# Patient Record
Sex: Female | Born: 1980 | Race: White | Hispanic: No | Marital: Married | Smoking: Former smoker
Health system: Southern US, Community
[De-identification: ages and names within clinical notes are randomized; demographics above are authoritative.]

## PROBLEM LIST (undated history)

## (undated) DIAGNOSIS — F419 Anxiety disorder, unspecified: Secondary | ICD-10-CM

## (undated) DIAGNOSIS — F32A Depression, unspecified: Secondary | ICD-10-CM

## (undated) DIAGNOSIS — J449 Chronic obstructive pulmonary disease, unspecified: Secondary | ICD-10-CM

## (undated) DIAGNOSIS — R7303 Prediabetes: Secondary | ICD-10-CM

## (undated) DIAGNOSIS — J45909 Unspecified asthma, uncomplicated: Secondary | ICD-10-CM

## (undated) HISTORY — PX: DENTAL SURGERY: SHX609

## (undated) HISTORY — PX: NO PAST SURGERIES: SHX2092

## (undated) HISTORY — PX: WISDOM TOOTH EXTRACTION: SHX21

---

## 2000-01-01 ENCOUNTER — Emergency Department (HOSPITAL_COMMUNITY): Admission: EM | Admit: 2000-01-01 | Discharge: 2000-01-01 | Payer: Self-pay | Admitting: *Deleted

## 2000-07-26 ENCOUNTER — Other Ambulatory Visit: Admission: RE | Admit: 2000-07-26 | Discharge: 2000-07-26 | Payer: Self-pay | Admitting: Obstetrics and Gynecology

## 2000-12-08 ENCOUNTER — Emergency Department (HOSPITAL_COMMUNITY): Admission: EM | Admit: 2000-12-08 | Discharge: 2000-12-08 | Payer: Self-pay | Admitting: Emergency Medicine

## 2001-12-22 ENCOUNTER — Emergency Department (HOSPITAL_COMMUNITY): Admission: EM | Admit: 2001-12-22 | Discharge: 2001-12-22 | Payer: Self-pay | Admitting: Emergency Medicine

## 2002-03-20 ENCOUNTER — Observation Stay (HOSPITAL_COMMUNITY): Admission: EM | Admit: 2002-03-20 | Discharge: 2002-03-21 | Payer: Self-pay | Admitting: Emergency Medicine

## 2002-03-20 ENCOUNTER — Encounter: Payer: Self-pay | Admitting: Obstetrics and Gynecology

## 2002-04-26 ENCOUNTER — Inpatient Hospital Stay (HOSPITAL_COMMUNITY): Admission: AD | Admit: 2002-04-26 | Discharge: 2002-04-26 | Payer: Self-pay | Admitting: Obstetrics and Gynecology

## 2002-04-27 ENCOUNTER — Encounter: Payer: Self-pay | Admitting: Obstetrics and Gynecology

## 2002-07-09 ENCOUNTER — Inpatient Hospital Stay (HOSPITAL_COMMUNITY): Admission: AD | Admit: 2002-07-09 | Discharge: 2002-07-09 | Payer: Self-pay | Admitting: Obstetrics and Gynecology

## 2002-10-14 ENCOUNTER — Inpatient Hospital Stay (HOSPITAL_COMMUNITY): Admission: AD | Admit: 2002-10-14 | Discharge: 2002-10-14 | Payer: Self-pay | Admitting: Obstetrics & Gynecology

## 2002-10-25 ENCOUNTER — Inpatient Hospital Stay (HOSPITAL_COMMUNITY): Admission: AD | Admit: 2002-10-25 | Discharge: 2002-10-25 | Payer: Self-pay | Admitting: Obstetrics and Gynecology

## 2002-10-30 ENCOUNTER — Inpatient Hospital Stay (HOSPITAL_COMMUNITY): Admission: AD | Admit: 2002-10-30 | Discharge: 2002-11-02 | Payer: Self-pay | Admitting: Obstetrics & Gynecology

## 2002-11-05 ENCOUNTER — Inpatient Hospital Stay (HOSPITAL_COMMUNITY): Admission: AD | Admit: 2002-11-05 | Discharge: 2002-11-05 | Payer: Self-pay | Admitting: Obstetrics and Gynecology

## 2002-11-30 ENCOUNTER — Other Ambulatory Visit: Admission: RE | Admit: 2002-11-30 | Discharge: 2002-11-30 | Payer: Self-pay | Admitting: Obstetrics and Gynecology

## 2003-12-04 ENCOUNTER — Ambulatory Visit (HOSPITAL_COMMUNITY): Admission: RE | Admit: 2003-12-04 | Discharge: 2003-12-04 | Payer: Self-pay | Admitting: Obstetrics & Gynecology

## 2004-06-29 ENCOUNTER — Emergency Department (HOSPITAL_COMMUNITY): Admission: EM | Admit: 2004-06-29 | Discharge: 2004-06-29 | Payer: Self-pay | Admitting: Family Medicine

## 2004-10-10 ENCOUNTER — Emergency Department (HOSPITAL_COMMUNITY): Admission: AD | Admit: 2004-10-10 | Discharge: 2004-10-10 | Payer: Self-pay | Admitting: Family Medicine

## 2005-06-21 ENCOUNTER — Emergency Department (HOSPITAL_COMMUNITY): Admission: EM | Admit: 2005-06-21 | Discharge: 2005-06-21 | Payer: Self-pay | Admitting: Emergency Medicine

## 2008-11-21 ENCOUNTER — Encounter: Admission: RE | Admit: 2008-11-21 | Discharge: 2008-11-21 | Payer: Self-pay | Admitting: Family Medicine

## 2013-08-13 ENCOUNTER — Emergency Department
Admission: EM | Admit: 2013-08-13 | Discharge: 2013-08-13 | Disposition: A | Discharge status: Home / Self Care | Payer: Self-pay | Source: Home / Self Care | Attending: Family Medicine | Admitting: Family Medicine

## 2013-08-13 DIAGNOSIS — J069 Acute upper respiratory infection, unspecified: Secondary | ICD-10-CM

## 2013-08-13 MED ORDER — BENZONATATE 200 MG PO CAPS: ORAL_CAPSULE | ORAL | Status: DC

## 2013-08-13 MED ORDER — AZITHROMYCIN 250 MG PO TABS: ORAL_TABLET | ORAL | Status: DC

## 2013-08-13 MED ORDER — PREDNISONE 20 MG PO TABS: 20.0000 mg | ORAL_TABLET | Freq: Two times a day (BID) | ORAL | Status: DC

## 2013-08-13 NOTE — ED Notes (Signed)
Sinus pain, pressure, fever, chills "ears feel like I'm under water", congestion, runny nose, headache, started with sore throat 6 days ago

## 2013-08-13 NOTE — ED Provider Notes (Signed)
CSN: [id number]     Arrival date & time 08/13/13  1729 History   First MD Initiated Contact with Patient 08/13/13 1816     Chief Complaint  Patient presents with  . Sinus Problem      HPI Comments: Patient states she had a URI last month that lasted about 3 weeks before resolving.  One week ago she developed a recurrent sore throat, nasal congestion, cough, fatigue, and myalgias.  Yesterday she developed a low grade fever.  She also developed a pressure sensation in her left ear yesterday.  The history is provided by the patient and the spouse.    Past medical history:  Hypertension, migraines No past surgical history   Family history:  Hypertension, Diabetes History  Substance Use Topics  . Smoking status: One pack per day   . Smokeless tobacco: Not on file  . Alcohol Use: Not on file   OB History   No data available     Review of Systems + sore throat + cough No pleuritic pain but has tightness in chest No wheezing + nasal congestion + post-nasal drainage + sinus pain/pressure No itchy/red eyes ? left earache No hemoptysis No SOB + low grade fever, + chills No nausea No vomiting No abdominal pain No diarrhea No urinary symptoms No skin rashes + fatigue + myalgias + headache Used OTC meds without relief  Allergies  Penicillins  Home Medications   Current Outpatient Rx  Name  Route  Sig  Dispense  Refill  . sertraline (ZOLOFT) 50 MG tablet   Oral   Take 50 mg by mouth daily.         Marland Kitchen azithromycin (ZITHROMAX Z-PAK) 250 MG tablet      Take 2 tabs today; then begin one tab once daily for 4 more days. (Rx void after 08/21/13)   6 each   0   . benzonatate (TESSALON) 200 MG capsule      Take one cap at bedtime as necessary for cough   12 capsule   0   . predniSONE (DELTASONE) 20 MG tablet   Oral   Take 1 tablet (20 mg total) by mouth 2 (two) times daily.   11 tablet   0    BP 135/87  Pulse 81  Temp(Src) 98.4 F (36.9 C) (Oral)  Ht 5\' 11"   (1.803 m)  Wt 176 lb (79.833 kg)  BMI 24.56 kg/m2  SpO2 100% Physical Exam Nursing notes and Vital Signs reviewed. Appearance:  Patient appears healthy, stated age, and in no acute distress Eyes:  Pupils are equal, round, and reactive to light and accomodation.  Extraocular movement is intact.  Conjunctivae are not inflamed  Ears:  Canals normal.  Tympanic membranes normal.  Nose:  Mildly congested turbinates.  No sinus tenderness.    Pharynx:  Normal Neck:  Supple.   Tender shotty posterior nodes are palpated bilaterally  Lungs:  Clear to auscultation.  Breath sounds are equal.  Heart:  Regular rate and rhythm without murmurs, rubs, or gallops.  Abdomen:  Nontender without masses or hepatosplenomegaly.  Bowel sounds are present.  No CVA or flank tenderness.  Extremities:  No edema.  No calf tenderness Skin:  No rash present.   ED Course  Procedures  none   Labs Reviewed -  Tympanogram:  Positive peak pressure left ear; normal right ear       MDM   1. Acute upper respiratory infections of unspecified site; suspect recurrent viral URI  There is no evidence of bacterial infection today.    Begin prednisone burst.  Prescription written for Benzonatate (Tessalon) to take at bedtime for night-time cough.  Take Mucinex D (guaifenesin with decongestant) twice daily for congestion (or may take plain Mucinex plus Sudafed).  Increase fluid intake, rest. May use Afrin nasal spray (or generic oxymetazoline) twice daily for about 5 days.  Also recommend using saline nasal spray several times daily and saline nasal irrigation (AYR is a common brand).  Use prescription nose spray after Afrin and saline rinse. Stop all antihistamines for now, and other non-prescription cough/cold preparations. Begin Azithromycin if not improving about one week or if persistent fever develops (Given a prescription to hold, with an expiration date)  Follow-up with family doctor if not improving about10 days.      Lattie Haw, MD 08/14/13 1239

## 2014-01-18 ENCOUNTER — Other Ambulatory Visit (HOSPITAL_COMMUNITY): Payer: Self-pay | Admitting: Sports Medicine

## 2014-01-18 DIAGNOSIS — M25569 Pain in unspecified knee: Principal | ICD-10-CM

## 2014-01-18 DIAGNOSIS — G8929 Other chronic pain: Secondary | ICD-10-CM

## 2014-02-05 ENCOUNTER — Ambulatory Visit (HOSPITAL_COMMUNITY)
Admission: RE | Admit: 2014-02-05 | Discharge: 2014-02-05 | Disposition: A | Discharge status: Home / Self Care | Payer: Self-pay | Source: Ambulatory Visit | Attending: Sports Medicine | Admitting: Sports Medicine

## 2014-02-05 DIAGNOSIS — M2390 Unspecified internal derangement of unspecified knee: Secondary | ICD-10-CM | POA: Insufficient documentation

## 2014-02-05 DIAGNOSIS — G8929 Other chronic pain: Secondary | ICD-10-CM

## 2014-02-05 DIAGNOSIS — M25569 Pain in unspecified knee: Secondary | ICD-10-CM | POA: Insufficient documentation

## 2015-01-21 ENCOUNTER — Ambulatory Visit
Admission: RE | Admit: 2015-01-21 | Discharge: 2015-01-21 | Disposition: A | Discharge status: Home / Self Care | Payer: 59 | Source: Ambulatory Visit | Attending: Family Medicine | Admitting: Family Medicine

## 2015-01-21 ENCOUNTER — Other Ambulatory Visit: Payer: Self-pay | Admitting: Family Medicine

## 2015-01-21 DIAGNOSIS — R609 Edema, unspecified: Secondary | ICD-10-CM

## 2015-01-21 DIAGNOSIS — M519 Unspecified thoracic, thoracolumbar and lumbosacral intervertebral disc disorder: Secondary | ICD-10-CM

## 2015-01-25 ENCOUNTER — Ambulatory Visit
Admission: RE | Admit: 2015-01-25 | Discharge: 2015-01-25 | Disposition: A | Discharge status: Home / Self Care | Payer: 59 | Source: Ambulatory Visit | Attending: Family Medicine | Admitting: Family Medicine

## 2015-01-25 DIAGNOSIS — M519 Unspecified thoracic, thoracolumbar and lumbosacral intervertebral disc disorder: Secondary | ICD-10-CM

## 2015-11-13 ENCOUNTER — Other Ambulatory Visit: Payer: Self-pay | Admitting: Family Medicine

## 2015-11-13 DIAGNOSIS — N6310 Unspecified lump in the right breast, unspecified quadrant: Secondary | ICD-10-CM

## 2015-11-17 ENCOUNTER — Other Ambulatory Visit (HOSPITAL_COMMUNITY): Payer: Self-pay | Admitting: *Deleted

## 2015-11-17 ENCOUNTER — Other Ambulatory Visit: Payer: Self-pay

## 2015-11-17 DIAGNOSIS — N6310 Unspecified lump in the right breast, unspecified quadrant: Secondary | ICD-10-CM

## 2015-11-20 ENCOUNTER — Other Ambulatory Visit (HOSPITAL_COMMUNITY): Payer: Self-pay | Admitting: Obstetrics and Gynecology

## 2015-11-20 ENCOUNTER — Encounter (HOSPITAL_COMMUNITY): Payer: Self-pay

## 2015-11-20 ENCOUNTER — Ambulatory Visit (HOSPITAL_COMMUNITY)
Admission: RE | Admit: 2015-11-20 | Discharge: 2015-11-20 | Disposition: A | Discharge status: Home / Self Care | Payer: No Typology Code available for payment source | Source: Ambulatory Visit | Attending: Obstetrics and Gynecology | Admitting: Obstetrics and Gynecology

## 2015-11-20 ENCOUNTER — Ambulatory Visit
Admission: RE | Admit: 2015-11-20 | Discharge: 2015-11-20 | Disposition: A | Discharge status: Home / Self Care | Payer: No Typology Code available for payment source | Source: Ambulatory Visit | Attending: Obstetrics and Gynecology | Admitting: Obstetrics and Gynecology

## 2015-11-20 VITALS — BP 118/72 | Temp 98.7°F | Ht 71.0 in | Wt 153.0 lb

## 2015-11-20 DIAGNOSIS — N6310 Unspecified lump in the right breast, unspecified quadrant: Secondary | ICD-10-CM

## 2015-11-20 DIAGNOSIS — Z1239 Encounter for other screening for malignant neoplasm of breast: Secondary | ICD-10-CM

## 2015-11-20 NOTE — Patient Instructions (Signed)
Educational materials on self breast awareness given. Explained to Maria Knight that she is due for a Knight smear. Let her know [facility] will cover Knight smears every 3 years unless has a history of abnormal Knight smears. Told patient to call Sabrina to schedule. Referred patient to the [facility] for diagnostic mammogram and possible right breast ultrasound. Appointment scheduled for Thursday, November 20, 2015 at 1500. Patient aware of appointment and will be there. Smoking cessation discussed with patient. Referred patient to the [facility] Quitline and gave resources to the free smoking cessation classes at the [facility]. Maria Knight verbalized understanding.  [place]ann[place], Kathaleen Maser, RN 1:31 PM

## 2015-11-20 NOTE — Progress Notes (Signed)
Complaints of three right breast lumps x 1 month. Patient complained of pain within the right breast that comes and goes. Patient rates the pain at a 5-6 out of 10.  Pap Smear:  Pap smear not completed today. Last Pap smear was 6 years ago at [facility] in [place] and normal per patient. Per patient has no history of an abnormal Pap smear. Pap smear not completed due to patient started menstrual cycle and stated it is heavy at this time. Patient told to call Martie Lee to schedule appointment with [facility]. No Pap smear results in EPIC.  Physical exam: Breasts Breasts symmetrical. No skin abnormalities bilateral breasts. No nipple retraction bilateral breasts. No nipple discharge bilateral breasts. No lymphadenopathy. No lumps palpated left breast. Palpated two moveable lumps within the right breast. Palpated a lump at 9 o'clock 3 cm from the nipple and at 12 o'clock 4 cm from the nipple. Complaints of tenderness when palpated both lumps. Referred patient to the [facility] for diagnostic mammogram and possible right breast ultrasound. Appointment scheduled for Thursday, November 20, 2015 at 1500.    Pelvic/Bimanual No Pap smear completed today since patient is currently on menstrual cycle. Patient will call to reschedule.  Smoking History: Smoking cessation discussed with patient. Referred patient to the [facility] Quitline and gave resources to the free smoking cessation classes at the [facility].  Patient Navigation: Patient education provided. Access to services provided for patient through [facility] program.

## 2015-11-21 ENCOUNTER — Other Ambulatory Visit: Payer: No Typology Code available for payment source

## 2015-11-24 ENCOUNTER — Encounter (HOSPITAL_COMMUNITY): Payer: Self-pay | Admitting: *Deleted

## 2016-05-03 ENCOUNTER — Telehealth (HOSPITAL_COMMUNITY): Payer: Self-pay | Admitting: *Deleted

## 2016-05-03 NOTE — Telephone Encounter (Signed)
Telephoned patient at home # and left message to return call to [facility] 

## 2017-06-30 ENCOUNTER — Other Ambulatory Visit: Payer: Self-pay | Admitting: Family Medicine

## 2017-06-30 DIAGNOSIS — R1032 Left lower quadrant pain: Secondary | ICD-10-CM

## 2017-06-30 DIAGNOSIS — R634 Abnormal weight loss: Secondary | ICD-10-CM

## 2017-07-06 ENCOUNTER — Other Ambulatory Visit: Payer: No Typology Code available for payment source

## 2017-07-20 ENCOUNTER — Ambulatory Visit
Admission: RE | Admit: 2017-07-20 | Discharge: 2017-07-20 | Disposition: A | Discharge status: Home / Self Care | Payer: No Typology Code available for payment source | Source: Ambulatory Visit | Attending: Family Medicine | Admitting: Family Medicine

## 2017-07-20 DIAGNOSIS — R1032 Left lower quadrant pain: Secondary | ICD-10-CM

## 2017-07-20 DIAGNOSIS — R634 Abnormal weight loss: Secondary | ICD-10-CM

## 2017-08-23 ENCOUNTER — Emergency Department (HOSPITAL_BASED_OUTPATIENT_CLINIC_OR_DEPARTMENT_OTHER)
Admission: EM | Admit: 2017-08-23 | Discharge: 2017-08-23 | Disposition: A | Discharge status: Home / Self Care | Payer: Self-pay | Attending: Emergency Medicine | Admitting: Emergency Medicine

## 2017-08-23 ENCOUNTER — Emergency Department (HOSPITAL_BASED_OUTPATIENT_CLINIC_OR_DEPARTMENT_OTHER): Payer: Self-pay

## 2017-08-23 ENCOUNTER — Encounter (HOSPITAL_BASED_OUTPATIENT_CLINIC_OR_DEPARTMENT_OTHER): Payer: Self-pay

## 2017-08-23 DIAGNOSIS — N760 Acute vaginitis: Secondary | ICD-10-CM | POA: Insufficient documentation

## 2017-08-23 DIAGNOSIS — Z79899 Other long term (current) drug therapy: Secondary | ICD-10-CM | POA: Insufficient documentation

## 2017-08-23 DIAGNOSIS — R1030 Lower abdominal pain, unspecified: Secondary | ICD-10-CM

## 2017-08-23 DIAGNOSIS — F17200 Nicotine dependence, unspecified, uncomplicated: Secondary | ICD-10-CM | POA: Insufficient documentation

## 2017-08-23 DIAGNOSIS — Z711 Person with feared health complaint in whom no diagnosis is made: Secondary | ICD-10-CM

## 2017-08-23 DIAGNOSIS — Z91040 Latex allergy status: Secondary | ICD-10-CM | POA: Insufficient documentation

## 2017-08-23 DIAGNOSIS — R1031 Right lower quadrant pain: Secondary | ICD-10-CM | POA: Insufficient documentation

## 2017-08-23 DIAGNOSIS — B9689 Other specified bacterial agents as the cause of diseases classified elsewhere: Secondary | ICD-10-CM

## 2017-08-23 HISTORY — DX: Anxiety disorder, unspecified: F41.9

## 2017-08-23 LAB — COMPREHENSIVE METABOLIC PANEL
ALBUMIN: 4.2 g/dL (ref 3.5–5.0)
ALT: 14 U/L (ref 14–54)
AST: 18 U/L (ref 15–41)
Alkaline Phosphatase: 63 U/L (ref 38–126)
Anion gap: 5 (ref 5–15)
BUN: 12 mg/dL (ref 6–20)
CALCIUM: 9.1 mg/dL (ref 8.9–10.3)
CO2: 25 mmol/L (ref 22–32)
Chloride: 108 mmol/L (ref 101–111)
Creatinine, Ser: 0.52 mg/dL (ref 0.44–1.00)
GFR calc Af Amer: >60 mL/min (ref >60)
GFR calc non Af Amer: >60 mL/min (ref >60)
GLUCOSE: 96 mg/dL (ref 65–99)
POTASSIUM: 3.8 mmol/L (ref 3.5–5.1)
SODIUM: 138 mmol/L (ref 135–145)
TOTAL PROTEIN: 6.8 g/dL (ref 6.5–8.1)
Total Bilirubin: 0.5 mg/dL (ref 0.3–1.2)

## 2017-08-23 LAB — CBC WITH DIFFERENTIAL/PLATELET
BASOS ABS: 0 10*3/uL (ref 0.0–0.1)
BASOS PCT: 0 %
EOS ABS: 0.1 10*3/uL (ref 0.0–0.7)
EOS PCT: 1 %
HCT: 32.9 % — ABNORMAL LOW (ref 36.0–46.0)
Hemoglobin: 10.9 g/dL — ABNORMAL LOW (ref 12.0–15.0)
Lymphocytes Relative: 21 %
Lymphs Abs: 2.1 10*3/uL (ref 0.7–4.0)
MCH: 30.8 pg (ref 26.0–34.0)
MCHC: 33.1 g/dL (ref 30.0–36.0)
MCV: 92.9 fL (ref 78.0–100.0)
MONO ABS: 0.7 10*3/uL (ref 0.1–1.0)
MONOS PCT: 8 %
Neutro Abs: 6.8 10*3/uL (ref 1.7–7.7)
Neutrophils Relative %: 70 %
PLATELETS: 157 10*3/uL (ref 150–400)
RBC: 3.54 MIL/uL — ABNORMAL LOW (ref 3.87–5.11)
RDW: 12.4 % (ref 11.5–15.5)
WBC: 9.8 10*3/uL (ref 4.0–10.5)

## 2017-08-23 LAB — URINALYSIS, ROUTINE W REFLEX MICROSCOPIC
Bilirubin Urine: NEGATIVE
Glucose, UA: NEGATIVE mg/dL
Hgb urine dipstick: NEGATIVE
Ketones, ur: NEGATIVE mg/dL
Leukocytes, UA: NEGATIVE
Nitrite: NEGATIVE
PH: 7.5 (ref 5.0–8.0)
Protein, ur: NEGATIVE mg/dL
SPECIFIC GRAVITY, URINE: 1.01 (ref 1.005–1.030)

## 2017-08-23 LAB — LIPASE, BLOOD: Lipase: 19 U/L (ref 11–51)

## 2017-08-23 LAB — WET PREP, GENITAL
Sperm: NONE SEEN
Trich, Wet Prep: NONE SEEN
YEAST WET PREP: NONE SEEN

## 2017-08-23 LAB — PREGNANCY, URINE: PREG TEST UR: NEGATIVE

## 2017-08-23 MED ORDER — METRONIDAZOLE 500 MG PO TABS: 500.0000 mg | ORAL_TABLET | Freq: Two times a day (BID) | ORAL | 0 refills | Status: DC

## 2017-08-23 MED ORDER — DICYCLOMINE HCL 10 MG PO CAPS
10.0000 mg | ORAL_CAPSULE | Freq: Once | ORAL | Status: AC
  Administered 2017-08-23: 10 mg via ORAL
  Filled 2017-08-23: qty 1

## 2017-08-23 MED ORDER — AZITHROMYCIN 250 MG PO TABS
1000.0000 mg | ORAL_TABLET | Freq: Once | ORAL | Status: AC
  Administered 2017-08-23: 1000 mg via ORAL
  Filled 2017-08-23: qty 4

## 2017-08-23 MED ORDER — POLYETHYLENE GLYCOL 3350 17 GM/SCOOP PO POWD: 17.0000 g | Freq: Two times a day (BID) | ORAL | 0 refills | Status: DC

## 2017-08-23 MED ORDER — CEFTRIAXONE SODIUM 250 MG IJ SOLR
250.0000 mg | Freq: Once | INTRAMUSCULAR | Status: AC
  Administered 2017-08-23: 250 mg via INTRAMUSCULAR
  Filled 2017-08-23: qty 250

## 2017-08-23 MED ORDER — ONDANSETRON 4 MG PO TBDP
4.0000 mg | ORAL_TABLET | Freq: Once | ORAL | Status: AC
  Administered 2017-08-23: 4 mg via ORAL
  Filled 2017-08-23: qty 1

## 2017-08-23 MED ORDER — IOPAMIDOL (ISOVUE-300) INJECTION 61%
100.0000 mL | Freq: Once | INTRAVENOUS | Status: AC | PRN
  Administered 2017-08-23: 100 mL via INTRAVENOUS

## 2017-08-23 MED ORDER — DICYCLOMINE HCL 20 MG PO TABS: 20.0000 mg | ORAL_TABLET | Freq: Two times a day (BID) | ORAL | 0 refills | Status: DC

## 2017-08-23 NOTE — ED Notes (Signed)
ED Provider at bedside. 

## 2017-08-23 NOTE — ED Triage Notes (Signed)
C/o abd pain since July 2-felt she had a pulled muscle-was seen by chiropractor and PCP with US done-"normal" US-pain worse x 4 days-pos nausea-denies v/d-NAD-steady gait

## 2017-08-23 NOTE — ED Provider Notes (Signed)
[facility] EMERGENCY DEPARTMENT Provider Note   CSN: [id number] Arrival date & time: 08/23/17  1343     History   Chief Complaint Chief Complaint  Patient presents with  . Abdominal Pain    HPI NASHA DISS is a 36 y.o. female.  NAVEYA ELLERMAN is a 36 y.o. Female who presents to the ED complaining of ongoing abdominal pain since April 25 2017.  Patient reports her pain began around July 2 of this year with pain to her left side of her abdomen and to her left back.  She saw a chiropractor and reports that this did not help with her symptoms.  She reports seeing her primary care doctor in September who did a pelvic ultrasound that was normal.  She reports her pain is been constant for the past several months and sometimes worsens.  She is unable to identify alleviating or aggravating factors.  She reports lately her pain has been starting in her left lower quadrant and radiates into her right lower quadrant.  She reports her pain worsened about 5 days ago.  She denies any nausea, vomiting or diarrhea.  Last bowel movement was today and was normal.  She also reports some pain in her abdomen when she urinates.  She denies urinary frequency or urgency.  She has had no previous abdominal surgeries.  No fevers.  No significant changes to her appetite recently.  She denies fevers, vaginal bleeding, vaginal discharge, urinary frequency, urinary urgency, nausea, vomiting, diarrhea, chest pain, or rashes.    The history is provided by the patient, medical records and the spouse. No language interpreter was used.    Past Medical History:  Diagnosis Date  . Anxiety     There are no active problems to display for this patient.   History reviewed. No pertinent surgical history.  OB History    Gravida Para Term Preterm AB Living   1 1 1     1    SAB TAB Ectopic Multiple Live Births                   Home Medications    Prior to Admission medications   Medication Sig Start  Date End Date Taking? Authorizing Provider  ALPRAZolam Prudy Feeler) 0.5 MG tablet Take 0.5 mg by mouth every morning.    [provider]  dicyclomine (BENTYL) 20 MG tablet Take 1 tablet (20 mg total) by mouth 2 (two) times daily. 08/23/17   Everlene Farrier, PA-C  metroNIDAZOLE (FLAGYL) 500 MG tablet Take 1 tablet (500 mg total) by mouth 2 (two) times daily. 08/23/17   Everlene Farrier, PA-C  polyethylene glycol powder (GLYCOLAX/MIRALAX) powder Take 17 g by mouth 2 (two) times daily. 08/23/17   Everlene Farrier, PA-C    Family History Family History  Problem Relation Age of Onset  . Diabetes Mother   . Cancer Mother        skin  . Hypertension Mother   . Diabetes Maternal Grandfather   . Heart disease Maternal Grandfather   . Stroke Maternal Grandfather     Social History Social History  Substance Use Topics  . Smoking status: Current Every Day Smoker    Packs/day: 0.50    Years: 18.00  . Smokeless tobacco: Never Used  . Alcohol use Yes     Comment: occ     Allergies   Latex and Penicillins   Review of Systems Review of Systems  Constitutional: Negative for chills and fever.  HENT:  Negative for congestion and sore throat.   Eyes: Negative for visual disturbance.  Respiratory: Negative for cough and shortness of breath.   Cardiovascular: Negative for chest pain.  Gastrointestinal: Positive for abdominal pain. Negative for blood in stool, constipation, diarrhea, nausea and vomiting.  Genitourinary: Positive for dysuria. Negative for decreased urine volume, difficulty urinating, flank pain, frequency, hematuria, menstrual problem, pelvic pain, urgency, vaginal bleeding, vaginal discharge and vaginal pain.  Musculoskeletal: Positive for back pain. Negative for neck pain.  Skin: Negative for rash.  Neurological: Negative for weakness, light-headedness, numbness and headaches.     Physical Exam Updated Vital Signs BP 121/81   Pulse 82   Temp 98.2 F (36.8 C) (Oral)    Resp 16   Ht 5\' 11"  (1.803 m)   Wt 57.7 kg (127 lb 3.3 oz)   LMP 07/06/2017   SpO2 100%   BMI 17.74 kg/m   Physical Exam  Constitutional: She appears well-developed and well-nourished. No distress.  Nontoxic-appearing.  HENT:  Head: Normocephalic and atraumatic.  Mouth/Throat: Oropharynx is clear and moist.  Eyes: Pupils are equal, round, and reactive to light. Conjunctivae are normal. Right eye exhibits no discharge. Left eye exhibits no discharge.  Neck: Neck supple.  Cardiovascular: Normal rate, regular rhythm, normal heart sounds and intact distal pulses.  Exam reveals no gallop and no friction rub.   No murmur heard. Pulmonary/Chest: Effort normal and breath sounds normal. No respiratory distress. She has no wheezes. She has no rales.  Abdominal: Soft. Bowel sounds are normal. She exhibits no distension and no mass. There is tenderness. There is no rebound and no guarding.  Abdomen is soft.  Bowel sounds are present.  Patient has tenderness across her bilateral lower abdomen including her suprapubic region.  No psoas or obturator sign.  No CVA tenderness.  Genitourinary:  Genitourinary Comments: Pelvic exam with female RN as chaperone. No external lesions or rashes noted. Mild white vaginal discharge noted. Cervix is closed. No CMT. No vaginal bleeding. No adnexal tenderness or fullness.   Musculoskeletal: She exhibits no edema.  Lymphadenopathy:    She has no cervical adenopathy.  Neurological: She is alert. Coordination normal.  Skin: Skin is warm and dry. No rash noted. She is not diaphoretic. No erythema. No pallor.  Psychiatric: She has a normal mood and affect. Her behavior is normal.  Nursing note and vitals reviewed.    ED Treatments / Results  Labs (all labs ordered are listed, but only abnormal results are displayed) Labs Reviewed  WET PREP, GENITAL - Abnormal; Notable for the following:       Result Value   Clue Cells Wet Prep HPF POC PRESENT (*)    WBC, Wet  Prep HPF POC MANY (*)    All other components within normal limits  CBC WITH DIFFERENTIAL/PLATELET - Abnormal; Notable for the following:    RBC 3.54 (*)    Hemoglobin 10.9 (*)    HCT 32.9 (*)    All other components within normal limits  URINALYSIS, ROUTINE W REFLEX MICROSCOPIC  PREGNANCY, URINE  COMPREHENSIVE METABOLIC PANEL  LIPASE, BLOOD  RPR  HIV ANTIBODY (ROUTINE TESTING)  GC/CHLAMYDIA PROBE AMP ([place]) NOT AT [facility]    EKG  EKG Interpretation None       Radiology Ct Abdomen Pelvis W Contrast  Result Date: 08/23/2017 CLINICAL DATA:  Lower abdominal pain since July 2018. Negative pregnancy test. EXAM: CT ABDOMEN AND PELVIS WITH CONTRAST TECHNIQUE: Multidetector CT imaging of the abdomen and pelvis was  performed using the standard protocol following bolus administration of intravenous contrast. CONTRAST:  100mL ISOVUE-300 IOPAMIDOL (ISOVUE-300) INJECTION 61% COMPARISON:  Ultrasound pelvis 12/04/2003 FINDINGS: Lower chest: Negative Hepatobiliary: Normal liver.  Gallbladder and bile ducts normal. Pancreas: Negative Spleen: Negative Adrenals/Urinary Tract: Negative.  No urinary tract calculi. Stomach/Bowel: Stomach is normal. Negative for bowel obstruction. No bowel mass or edema. Appendix not visualized but no evidence of bowel thickening in the right lower quadrant. Vascular/Lymphatic: Negative Reproductive: Normal uterus. Multiple cysts in the right adnexum, the largest being 30 x 39 mm. Other cysts measure 1 cm. Left adnexum normal. Other: No free fluid. Musculoskeletal: Negative IMPRESSION: Right ovarian cysts, similar in size to the described in 2005. No free fluid. These are likely functional follicles. Otherwise negative. Appendix not visualized. Electronically Signed   By: Marlan Palauharles  Clark M.D.   On: 08/23/2017 19:13    Procedures Procedures (including critical care time)  Medications Ordered in ED Medications  cefTRIAXone (ROCEPHIN) injection 250 mg (not administered)    azithromycin (ZITHROMAX) tablet 1,000 mg (not administered)  iopamidol (ISOVUE-300) 61 % injection 100 mL (100 mLs Intravenous Contrast Given 08/23/17 1840)  dicyclomine (BENTYL) capsule 10 mg (10 mg Oral Given 08/23/17 2008)     Initial Impression / Assessment and Plan / ED Course  I have reviewed the triage vital signs and the nursing notes.  Pertinent labs & imaging results that were available during my care of the patient were reviewed by me and considered in my medical decision making (see chart for details).     This  is a 36 y.o. Female who presents to the ED complaining of ongoing abdominal pain since April 25 2017.  Patient reports her pain began around July 2 of this year with pain to her left side of her abdomen and to her left back.  She saw a chiropractor and reports that this did not help with her symptoms.  She reports seeing her primary care doctor in September who did a pelvic ultrasound that was normal.  She reports her pain is been constant for the past several months and sometimes worsens.  She is unable to identify alleviating or aggravating factors.  She reports lately her pain has been starting in her left lower quadrant and radiates into her right lower quadrant.  She reports her pain worsened about 5 days ago.  She denies any nausea, vomiting or diarrhea.  Last bowel movement was today and was normal.  She also reports some pain in her abdomen when she urinates.  She denies urinary frequency or urgency.  She has had no previous abdominal surgeries.  No fevers.  She denies vaginal bleeding or discharge.  On exam the patient is afebrile nontoxic-appearing.  Her abdomen is soft and she has bilateral lower abdominal tenderness to palpation and suprapubic tenderness to palpation.  No psoas or obturator sign.  No CVA or flank tenderness. A pregnancy test is negative.  Urinalysis is without sign of action.  CBC shows no leukocytosis.  Lipase is within normal limits.  CMP is  unremarkable.  CT abdomen and pelvis with contrast was obtained which showed right ovarian cysts which are similar in size that were described in 2005.  No free fluid.  These are likely functional follicles.  No other findings on CT scan.  At reevaluation I discussed test findings.  Plan for pelvic exam and STD check.  Patient agrees with plan. On pelvic exam she has a slight amount of white vaginal discharge.  No cervical  motion tenderness.  No adnexal tenderness or fullness.  Wet prep reveals many white blood cells and bacterial vaginosis.  I discussed test findings.  I discussed that she has pending testing for STDs.  She agrees with plan to prophylactically treat for STDs with Rocephin and azithromycin here in the emergency department.  Will also treat with Flagyl for bacterial vaginosis at home.  Patient tolerating p.o. in the emergency department.  Will discharge with some Bentyl and MiraLAX in case this is irritable bowel versus constipation.  We will have her follow closely with primary care and I suggested that if her symptoms persist she may want follow-up with gastroenterology.  She agrees with plan.  Return precautions discussed. I advised the patient to follow-up with their primary care provider this week. I advised the patient to return to the emergency department with new or worsening symptoms or new concerns. The patient verbalized understanding and agreement with plan.    Final Clinical Impressions(s) / ED Diagnoses   Final diagnoses:  Lower abdominal pain  Concern about STD in female without diagnosis  BV (bacterial vaginosis)    New Prescriptions New Prescriptions   DICYCLOMINE (BENTYL) 20 MG TABLET    Take 1 tablet (20 mg total) by mouth 2 (two) times daily.   METRONIDAZOLE (FLAGYL) 500 MG TABLET    Take 1 tablet (500 mg total) by mouth 2 (two) times daily.   POLYETHYLENE GLYCOL POWDER (GLYCOLAX/MIRALAX) POWDER    Take 17 g by mouth 2 (two) times daily.     Everlene Farrier, PA-C 08/23/17 2050    Arby Barrette, MD 08/24/17 Ebony Cargo

## 2017-08-25 LAB — HIV ANTIBODY (ROUTINE TESTING W REFLEX): HIV Screen 4th Generation wRfx: NONREACTIVE

## 2017-08-25 LAB — RPR: RPR Ser Ql: NONREACTIVE

## 2017-08-25 LAB — GC/CHLAMYDIA PROBE AMP (~~LOC~~) NOT AT ARMC
CHLAMYDIA, DNA PROBE: NEGATIVE
Neisseria Gonorrhea: NEGATIVE

## 2018-12-08 ENCOUNTER — Ambulatory Visit (HOSPITAL_COMMUNITY): Payer: Self-pay | Admitting: Psychiatry

## 2022-03-08 ENCOUNTER — Emergency Department (HOSPITAL_BASED_OUTPATIENT_CLINIC_OR_DEPARTMENT_OTHER): Payer: Self-pay | Admitting: Radiology

## 2022-03-08 ENCOUNTER — Emergency Department (HOSPITAL_BASED_OUTPATIENT_CLINIC_OR_DEPARTMENT_OTHER): Payer: Self-pay

## 2022-03-08 ENCOUNTER — Emergency Department (HOSPITAL_BASED_OUTPATIENT_CLINIC_OR_DEPARTMENT_OTHER)
Admission: EM | Admit: 2022-03-08 | Discharge: 2022-03-09 | Disposition: A | Discharge status: Home / Self Care | Payer: Self-pay | Attending: Emergency Medicine | Admitting: Emergency Medicine

## 2022-03-08 ENCOUNTER — Other Ambulatory Visit: Payer: Self-pay

## 2022-03-08 ENCOUNTER — Encounter (HOSPITAL_BASED_OUTPATIENT_CLINIC_OR_DEPARTMENT_OTHER): Payer: Self-pay

## 2022-03-08 DIAGNOSIS — Y9389 Activity, other specified: Secondary | ICD-10-CM | POA: Insufficient documentation

## 2022-03-08 DIAGNOSIS — S92324A Nondisplaced fracture of second metatarsal bone, right foot, initial encounter for closed fracture: Secondary | ICD-10-CM | POA: Insufficient documentation

## 2022-03-08 DIAGNOSIS — M25561 Pain in right knee: Secondary | ICD-10-CM | POA: Insufficient documentation

## 2022-03-08 DIAGNOSIS — W1839XA Other fall on same level, initial encounter: Secondary | ICD-10-CM | POA: Insufficient documentation

## 2022-03-08 DIAGNOSIS — M542 Cervicalgia: Secondary | ICD-10-CM | POA: Insufficient documentation

## 2022-03-08 DIAGNOSIS — Z91040 Latex allergy status: Secondary | ICD-10-CM | POA: Insufficient documentation

## 2022-03-08 DIAGNOSIS — S92334A Nondisplaced fracture of third metatarsal bone, right foot, initial encounter for closed fracture: Secondary | ICD-10-CM | POA: Insufficient documentation

## 2022-03-08 DIAGNOSIS — S92344A Nondisplaced fracture of fourth metatarsal bone, right foot, initial encounter for closed fracture: Secondary | ICD-10-CM | POA: Insufficient documentation

## 2022-03-08 MED ORDER — HYDROCODONE-ACETAMINOPHEN 5-325 MG PO TABS
1.0000 | ORAL_TABLET | ORAL | 0 refills | Status: DC | PRN
Start: 2022-03-08 — End: 2024-05-07

## 2022-03-08 MED ORDER — HYDROCODONE-ACETAMINOPHEN 5-325 MG PO TABS
1.0000 | ORAL_TABLET | Freq: Once | ORAL | Status: AC
  Administered 2022-03-08: 1 via ORAL
  Filled 2022-03-08: qty 1

## 2022-03-08 NOTE — Discharge Instructions (Addendum)
You were seen today for multiple injuries secondary to a fall.  Fractures were noted in the second, third, and fourth metatarsal bones.  You have been placed in a splint.  Please keep the right lower extremity nonweightbearing at all times.  Utilize crutches for mobility.  I have provided an orthopedic providers information that you should contact tomorrow morning for follow-up in the next few days.  I have provided a short course of pain medication. You may also use ice for inflammation ?

## 2022-03-08 NOTE — ED Provider Notes (Signed)
?[facility]PT ?Provider Note ? ? ?CSN: [id number] ?Arrival date & time: 03/08/22  2012 ? ?  ? ?History ? ?Chief Complaint  ?Patient presents with  ? Fall  ? ? ?Maria Knight is a 41 y.o. female.  Patient presents to the hospital with a chief complaint of right foot pain, right knee pain, neck pain, and chest pain secondary to a fall.  Patient states she fell while trying to catch her dog hitting her chest on a low brick wall and head on the ground.  No obvious head trauma upon arrival.  Patient is in c-collar upon my exam.  Patient unable to bear weight on the right foot.  No relevant past medical history ? ?HPI ? ?  ? ?Home Medications ?Prior to Admission medications   ?Medication Sig Start Date End Date Taking? Authorizing Provider  ?HYDROcodone-acetaminophen (NORCO/VICODIN) 5-325 MG tablet Take 1 tablet by mouth every 4 (four) hours as needed. 03/08/22  Yes Darrick Grinder, PA-C  ?ALPRAZolam (XANAX) 0.5 MG tablet Take 0.5 mg by mouth every morning.    [provider]  ?dicyclomine (BENTYL) 20 MG tablet Take 1 tablet (20 mg total) by mouth 2 (two) times daily. 08/23/17   Everlene Farrier, PA-C  ?metroNIDAZOLE (FLAGYL) 500 MG tablet Take 1 tablet (500 mg total) by mouth 2 (two) times daily. 08/23/17   Everlene Farrier, PA-C  ?polyethylene glycol powder (GLYCOLAX/MIRALAX) powder Take 17 g by mouth 2 (two) times daily. 08/23/17   Everlene Farrier, PA-C  ?   ? ?Allergies    ?Latex and Penicillins   ? ?Review of Systems   ?Review of Systems  ?Respiratory:  Negative for shortness of breath.   ?Cardiovascular:  Negative for chest pain.  ?Gastrointestinal:  Negative for abdominal pain.  ?Musculoskeletal:  Positive for arthralgias and neck pain.  ?Skin:   ?     Bruising to the chest  ?Neurological:  Negative for syncope and light-headedness.  ? ?Physical Exam ?Updated Vital Signs ?BP 123/84   Pulse 78   Temp 98.5 ?F (36.9 ?C) (Temporal)   Resp 18   Ht 5\' 11"  (1.803 m)   Wt 57.7 kg    LMP 02/22/2022   SpO2 95%   BMI 17.74 kg/m?  ?Physical Exam ?Vitals and nursing note reviewed.  ?Constitutional:   ?   General: She is not in acute distress. ?HENT:  ?   Head: Normocephalic and atraumatic.  ?Eyes:  ?   Conjunctiva/sclera: Conjunctivae normal.  ?   Pupils: Pupils are equal, round, and reactive to light.  ?Neck:  ?   Comments: Initial exam in c-collar.  Midline neck tenderness.  Tenderness from midline to left trapezius. ?Cardiovascular:  ?   Rate and Rhythm: Normal rate and regular rhythm.  ?   Pulses: Normal pulses.  ?Pulmonary:  ?   Effort: Pulmonary effort is normal.  ?   Breath sounds: Normal breath sounds.  ?Musculoskeletal:     ?   General: Tenderness and signs of injury present. No swelling or deformity.  ?   Cervical back: Normal range of motion and neck supple. Tenderness present.  ?   Comments: No deformity noted to right knee or right foot.  Tenderness to palpation in general about the right knee.  Tenderness to left foot along metatarsals  ?Skin: ?   General: Skin is warm and dry.  ?Neurological:  ?   Mental Status: She is alert and oriented to person, place, and time.  ? ? ?ED  Results / Procedures / Treatments   ?Labs ?(all labs ordered are listed, but only abnormal results are displayed) ?Labs Reviewed - No data to display ? ?EKG ?None ? ?Radiology ?DG Chest 2 View ? ?Result Date: 03/08/2022 ?CLINICAL DATA:  Fall EXAM: CHEST - 2 VIEW COMPARISON:  None Available. FINDINGS: The heart size and mediastinal contours are within normal limits. Both lungs are clear. The visualized skeletal structures are unremarkable. IMPRESSION: No active cardiopulmonary disease. Electronically Signed   By: Charlett NoseKevin  Dover M.D.   On: 03/08/2022 22:22  ? ?CT Cervical Spine Wo Contrast ? ?Result Date: 03/08/2022 ?CLINICAL DATA:  Fall.  Neck trauma, midline tenderness (Age 43-64y) EXAM: CT CERVICAL SPINE WITHOUT CONTRAST TECHNIQUE: Multidetector CT imaging of the cervical spine was performed without intravenous  contrast. Multiplanar CT image reconstructions were also generated. RADIATION DOSE REDUCTION: This exam was performed according to the departmental dose-optimization program which includes automated exposure control, adjustment of the mA and/or kV according to patient size and/or use of iterative reconstruction technique. COMPARISON:  None Available. FINDINGS: Alignment: Normal. Skull base and vertebrae: No acute fracture. No primary bone lesion or focal pathologic process. Soft tissues and spinal canal: No prevertebral fluid or swelling. No visible canal hematoma. Disc levels:  Maintained Upper chest: Negative Other: None IMPRESSION: Negative. Electronically Signed   By: Charlett NoseKevin  Dover M.D.   On: 03/08/2022 22:23  ? ?DG Knee Complete 4 Views Right ? ?Result Date: 03/08/2022 ?CLINICAL DATA:  Fall. EXAM: RIGHT KNEE - COMPLETE 4+ VIEW COMPARISON:  None Available. FINDINGS: No evidence of fracture, dislocation, or joint effusion. No evidence of arthropathy or other focal bone abnormality. Soft tissues are unremarkable. IMPRESSION: Negative. Electronically Signed   By: Darliss CheneyAmy  Guttmann M.D.   On: 03/08/2022 21:14  ? ?DG Foot Complete Right ? ?Result Date: 03/08/2022 ?CLINICAL DATA:  Fall. EXAM: RIGHT FOOT COMPLETE - 3+ VIEW COMPARISON:  None Available. FINDINGS: There are acute transverse nondisplaced fractures through the proximal second, third and fourth metatarsals. There is overlying soft tissue swelling. There is no evidence for dislocation. Joint spaces are well maintained. IMPRESSION: 1. Acute fractures through the proximal second, third and fourth metatarsals. Electronically Signed   By: Darliss CheneyAmy  Guttmann M.D.   On: 03/08/2022 21:15   ? ?Procedures ?Marland Kitchen.Ortho Injury Treatment ? ?Date/Time: 03/08/2022 11:18 PM ?Performed by: Darrick GrinderMcCauley, Lashane Whelpley B, PA-C ?Authorized by: Darrick GrinderMcCauley, Devarious Pavek B, PA-C  ? ?Consent:  ?  Consent obtained:  Verbal ?  Consent given by:  Patient ?  Risks discussed:  Nerve damage, restricted joint movement,  vascular damage and stiffness ?  Alternatives discussed:  No treatmentInjury location: foot ?Location details: right foot ?Injury type: fracture ?Fracture type: second metatarsal, third metatarsal and fourth metatarsal ?Pre-procedure neurovascular assessment: neurovascularly intact ?Manipulation performed: no ?Immobilization: splint ?Splint type: short leg ?Splint Applied by: ED Tech ?Post-procedure neurovascular assessment: post-procedure neurovascularly intact ? ?  ? ?Medications Ordered in ED ?Medications  ?HYDROcodone-acetaminophen (NORCO/VICODIN) 5-325 MG per tablet 1 tablet (1 tablet Oral Given 03/08/22 2256)  ? ? ?ED Course/ Medical Decision Making/ A&P ?  ?                        ?Medical Decision Making ?Amount and/or Complexity of Data Reviewed ?Radiology: ordered. ? ?Risk ?Prescription drug management. ? ? ?The patient presents with multiple injuries including right foot pain, right knee pain, neck pain, and chest bruising. ? ?I ordered imaging including CT C-spine, chest x-ray, right knee x-ray, and right foot  x-ray.  No cervical spine injury noted.  No acute process noted in the chest.  No fracture or dislocation noted to the right knee.  Fracture noted to the proximal portion of the second third and fourth metatarsal on the right foot with no displacement.  I agree with the radiologist findings. ? ?Splint placed as noted above for the patient's right foot. ? ?The patient will be nonweightbearing on the right lower extremity.  Crutches were provided.  The patient will follow-up outpatient with Dr. Dion Saucier, orthopedist.  I will provide a short course of pain medication for the patient.  She may use ice as well.  Discharge home ? ?Final Clinical Impression(s) / ED Diagnoses ?Final diagnoses:  ?Closed nondisplaced fracture of second metatarsal bone of right foot, initial encounter  ?Closed nondisplaced fracture of third metatarsal bone of right foot, initial encounter  ?Closed nondisplaced fracture of fourth  metatarsal bone of right foot, initial encounter  ?Neck pain  ?Acute pain of right knee  ? ? ?Rx / DC Orders ?ED Discharge Orders   ? ?      Ordered  ?  HYDROcodone-acetaminophen (NORCO/VICODIN) 5-325 MG tablet  Every

## 2022-03-08 NOTE — ED Triage Notes (Addendum)
Patient here POV from Home. ? ?Endorses Falling incident with Dog in which she fell onto [employer]. Head Injury to Ground. Endorses Injuring her Right Foot, Right Knee, Right Elbow and Head. Also endorses Neck Pain. ? ?No LOC. Occurred Approximately 1 Hour PTA. No Anticoagulants. ? ?NAD Noted during Triage. A&Ox4. GCS 15. BIB Wheelchair. ?

## 2022-03-09 NOTE — ED Notes (Signed)
Pt verbalizes understanding of discharge instructions. Opportunity for questioning and answers were provided. Pt discharged from ED to home with significant other.   ? ?

## 2023-03-16 DIAGNOSIS — L03317 Cellulitis of buttock: Secondary | ICD-10-CM | POA: Diagnosis not present

## 2023-03-16 DIAGNOSIS — L0231 Cutaneous abscess of buttock: Secondary | ICD-10-CM | POA: Diagnosis not present

## 2023-03-29 DIAGNOSIS — J0101 Acute recurrent maxillary sinusitis: Secondary | ICD-10-CM | POA: Diagnosis not present

## 2023-04-18 DIAGNOSIS — J0101 Acute recurrent maxillary sinusitis: Secondary | ICD-10-CM | POA: Diagnosis not present

## 2023-04-25 DIAGNOSIS — J0101 Acute recurrent maxillary sinusitis: Secondary | ICD-10-CM | POA: Diagnosis not present

## 2023-04-27 DIAGNOSIS — R051 Acute cough: Secondary | ICD-10-CM | POA: Diagnosis not present

## 2023-04-27 DIAGNOSIS — J4540 Moderate persistent asthma, uncomplicated: Secondary | ICD-10-CM | POA: Diagnosis not present

## 2023-04-27 DIAGNOSIS — J418 Mixed simple and mucopurulent chronic bronchitis: Secondary | ICD-10-CM | POA: Diagnosis not present

## 2023-04-27 DIAGNOSIS — J329 Chronic sinusitis, unspecified: Secondary | ICD-10-CM | POA: Diagnosis not present

## 2023-04-27 DIAGNOSIS — Z20822 Contact with and (suspected) exposure to covid-19: Secondary | ICD-10-CM | POA: Diagnosis not present

## 2023-04-27 DIAGNOSIS — G8929 Other chronic pain: Secondary | ICD-10-CM | POA: Diagnosis not present

## 2023-04-27 DIAGNOSIS — R519 Headache, unspecified: Secondary | ICD-10-CM | POA: Diagnosis not present

## 2023-04-27 DIAGNOSIS — R0981 Nasal congestion: Secondary | ICD-10-CM | POA: Diagnosis not present

## 2023-05-31 DIAGNOSIS — R070 Pain in throat: Secondary | ICD-10-CM | POA: Diagnosis not present

## 2023-05-31 DIAGNOSIS — Z20822 Contact with and (suspected) exposure to covid-19: Secondary | ICD-10-CM | POA: Diagnosis not present

## 2023-05-31 DIAGNOSIS — H6503 Acute serous otitis media, bilateral: Secondary | ICD-10-CM | POA: Diagnosis not present

## 2023-06-14 DIAGNOSIS — J309 Allergic rhinitis, unspecified: Secondary | ICD-10-CM | POA: Diagnosis not present

## 2023-06-14 DIAGNOSIS — J4540 Moderate persistent asthma, uncomplicated: Secondary | ICD-10-CM | POA: Diagnosis not present

## 2023-06-14 DIAGNOSIS — J418 Mixed simple and mucopurulent chronic bronchitis: Secondary | ICD-10-CM | POA: Diagnosis not present

## 2023-06-15 DIAGNOSIS — L03032 Cellulitis of left toe: Secondary | ICD-10-CM | POA: Diagnosis not present

## 2023-07-01 ENCOUNTER — Encounter: Payer: Self-pay | Admitting: Podiatry

## 2023-07-01 ENCOUNTER — Ambulatory Visit: Payer: 59 | Admitting: Podiatry

## 2023-07-01 DIAGNOSIS — L600 Ingrowing nail: Secondary | ICD-10-CM

## 2023-07-01 NOTE — Patient Instructions (Signed)

## 2023-07-03 NOTE — Progress Notes (Signed)
Subjective:   Patient ID: Maria Knight, female   DOB: 42 y.o.   MRN: [id number]   HPI Patient presents with chronic ingrown toenail deformity of the big toes both feet and states they have both been very sore and it has been going on for years.  She has tried to trim them soak them without relief and has had history of infections with antibiotics in the past.  Patient does not smoke likes to be active   Review of Systems  All other systems reviewed and are negative.       Objective:  Physical Exam Vitals and nursing note reviewed.  Constitutional:      Appearance: She is well-developed.  Pulmonary:     Effort: Pulmonary effort is normal.  Musculoskeletal:        General: Normal range of motion.  Skin:    General: Skin is warm.  Neurological:     Mental Status: She is alert.     Neurovascular status intact muscle strength was found to be adequate range of motion adequate with the patient noted to have incurvated medial and lateral borders of the big toes of both feet that are painful when pressed no active redness drainage noted currently.  Good digital perfusion well-oriented     Assessment:  Chronic grown toenail deformity hallux bilateral with pain     Plan:  H&P reviewed recommended correction of both digits and patient wants this done and read then signed consent form.  I infiltrated each big toe 60 mg Xylocaine Marcaine mixture sterile prep done using sterile instrumentation remove the medial and lateral borders exposed matrix applied phenol 3 applications 30 seconds followed by alcohol lavage sterile dressing each border and applied sterile dressings with instructions to leave dressings on 24 hours take them off earlier if throbbing were to occur and encouraged to call questions concerns which may arise

## 2023-08-02 DIAGNOSIS — J209 Acute bronchitis, unspecified: Secondary | ICD-10-CM | POA: Diagnosis not present

## 2023-08-02 DIAGNOSIS — J329 Chronic sinusitis, unspecified: Secondary | ICD-10-CM | POA: Diagnosis not present

## 2023-08-02 DIAGNOSIS — J4489 Other specified chronic obstructive pulmonary disease: Secondary | ICD-10-CM | POA: Diagnosis not present

## 2023-08-02 DIAGNOSIS — R0981 Nasal congestion: Secondary | ICD-10-CM | POA: Diagnosis not present

## 2023-08-02 DIAGNOSIS — J4540 Moderate persistent asthma, uncomplicated: Secondary | ICD-10-CM | POA: Diagnosis not present

## 2023-08-03 DIAGNOSIS — J4540 Moderate persistent asthma, uncomplicated: Secondary | ICD-10-CM | POA: Diagnosis not present

## 2023-08-03 DIAGNOSIS — J209 Acute bronchitis, unspecified: Secondary | ICD-10-CM | POA: Diagnosis not present

## 2023-09-21 IMAGING — CT CT CERVICAL SPINE W/O CM
3 of 4 series · 9 of 33 positions shown, 11 images · non-contrast
Comparison: None Available.

CLINICAL DATA: Fall.  Neck trauma, midline tenderness (Age 16-64y)



[Series 5: cor bone · coronal · 0.22mm/px · 3 of 51 slices shown]
[im 11/51  bone]
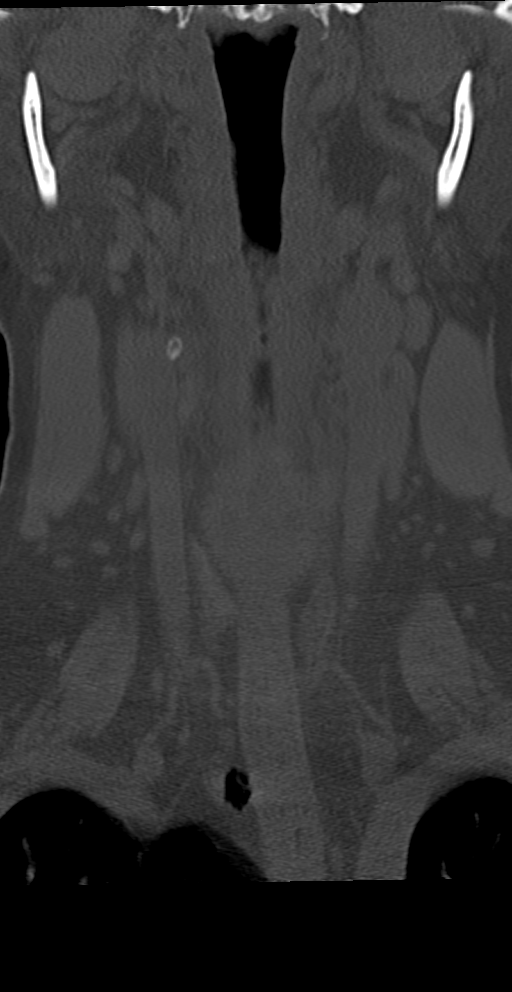
[im 21/51  bone]
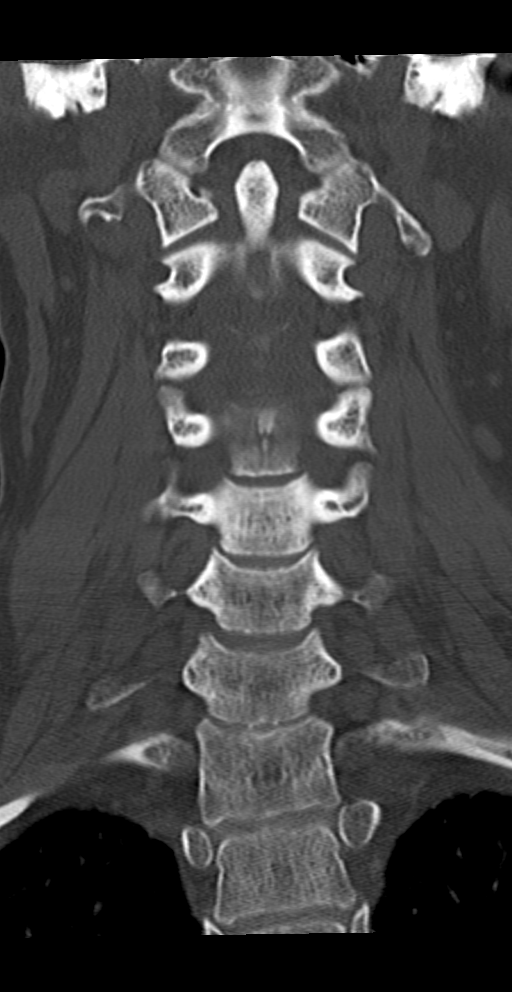
[im 31/51  bone]
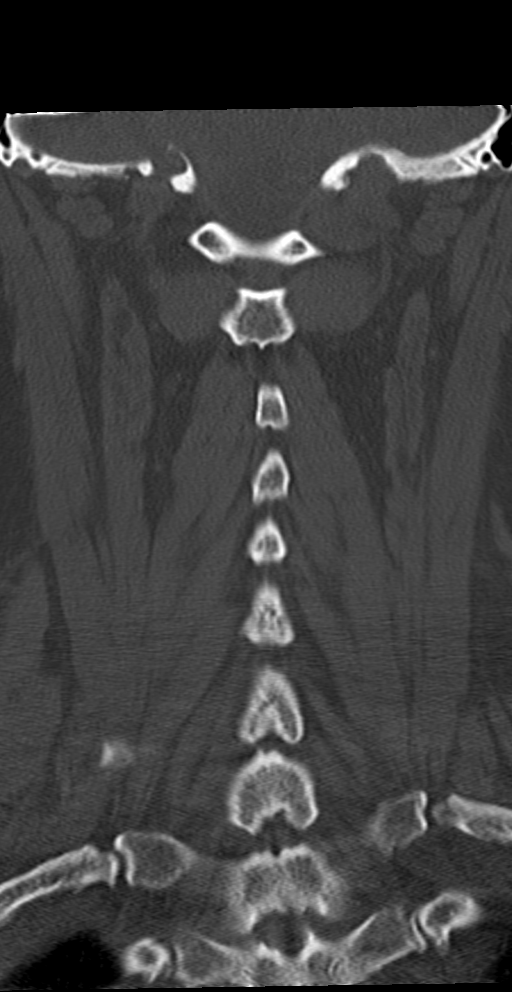

[Series 6: sag bone · sagittal · 0.20mm/px · 5 of 56 slices shown, 6 images]
[im 19/56  bone]
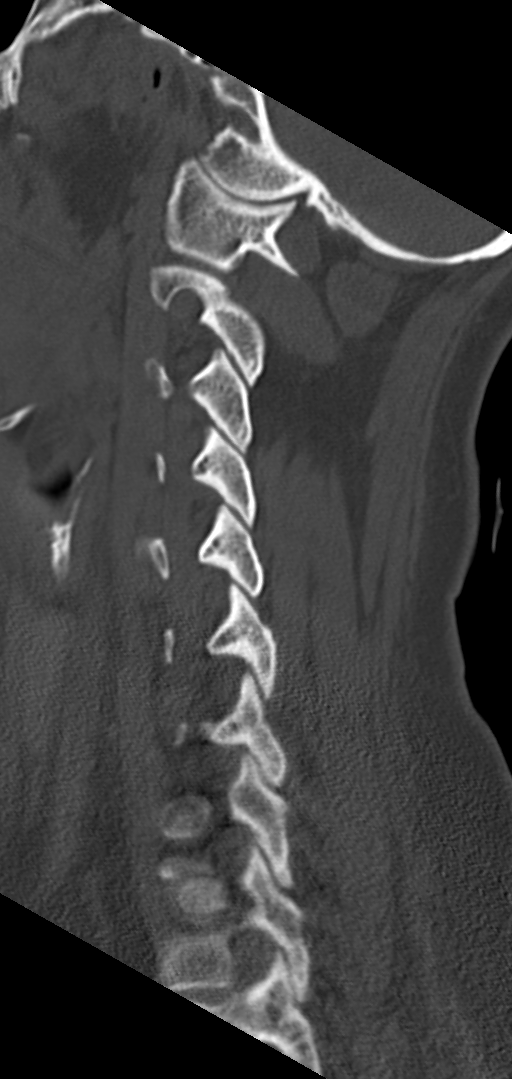
[im 23/56  bone]
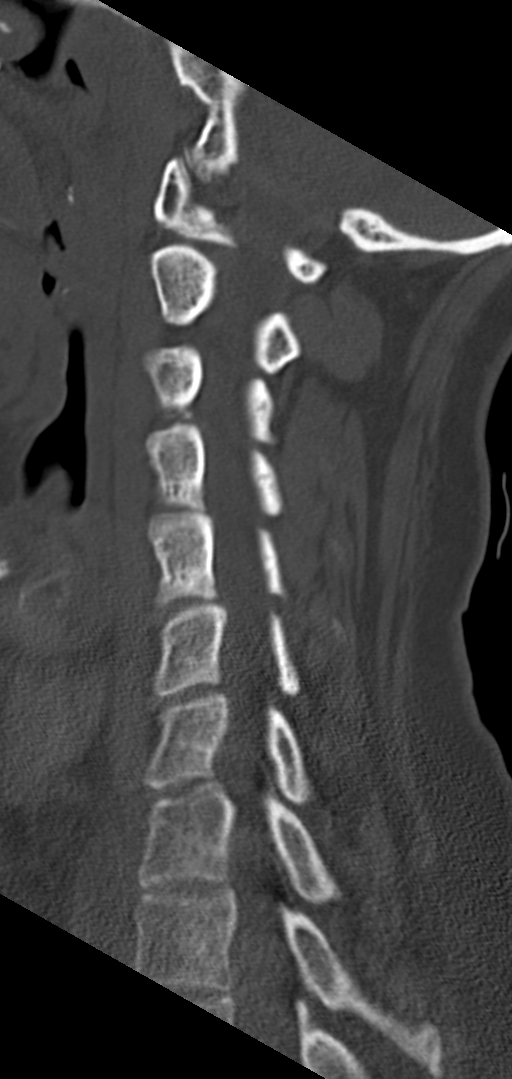
[im 28/56  soft-tissue]
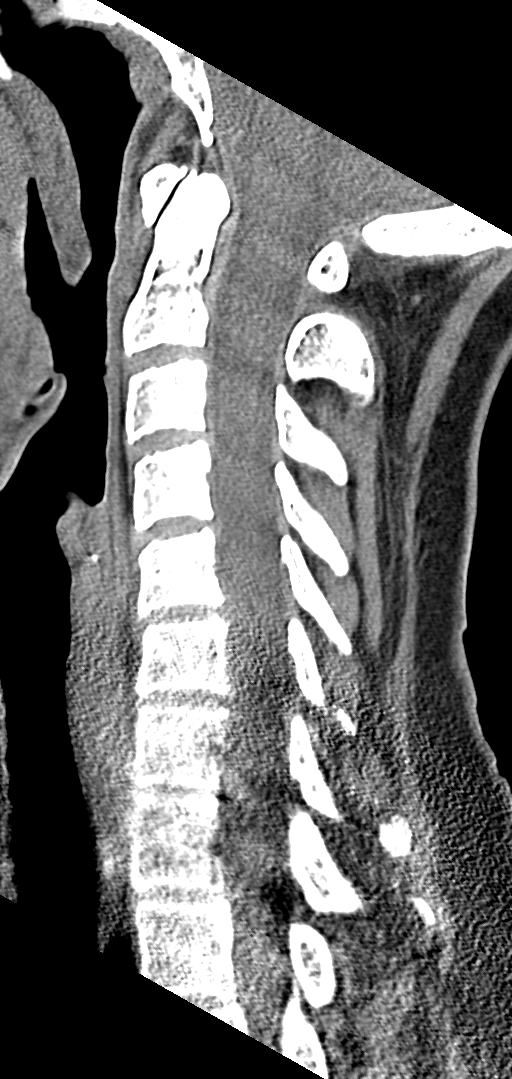
[im 28/56  bone]
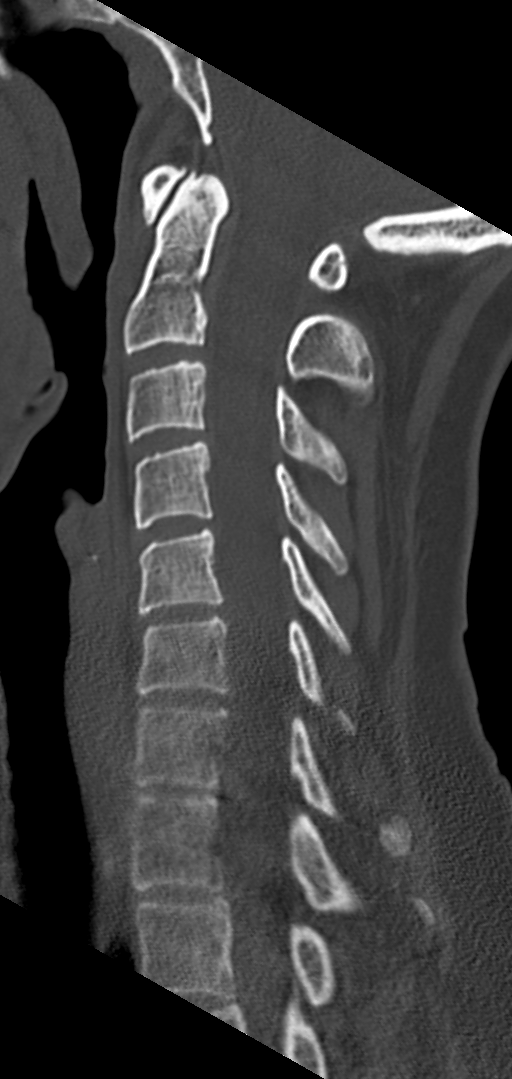
[im 33/56  bone]
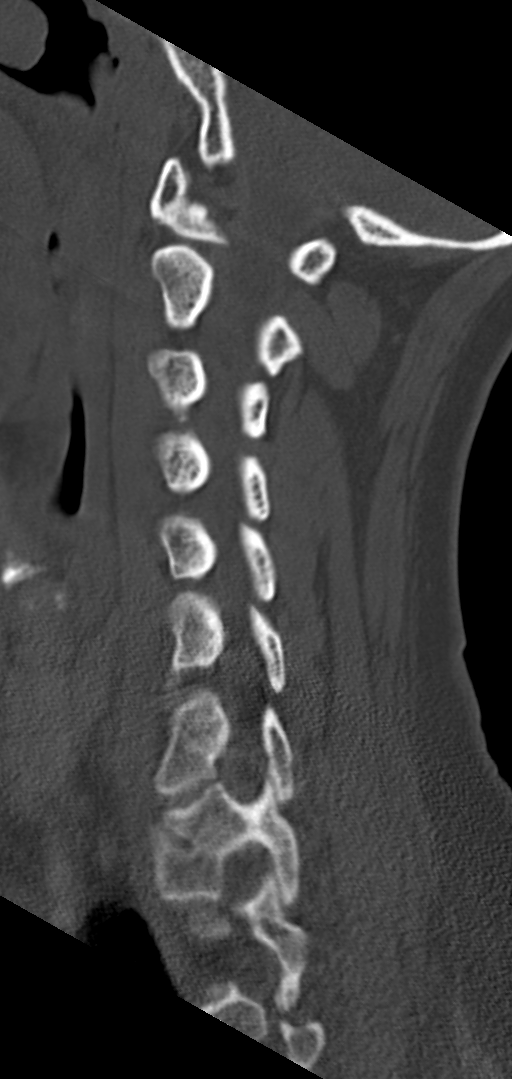
[im 37/56  bone]
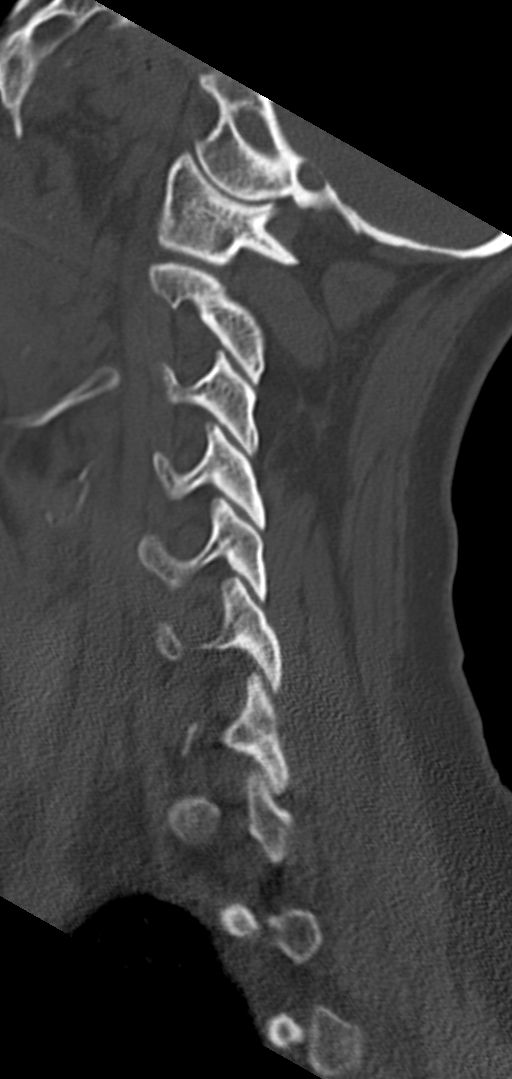

[Series 7: orthogonal axials · axial · 0.21mm/px · z∈[-551,-551]mm · 1 of 92 slices shown, 2 images]
[im 46/92  soft-tissue]
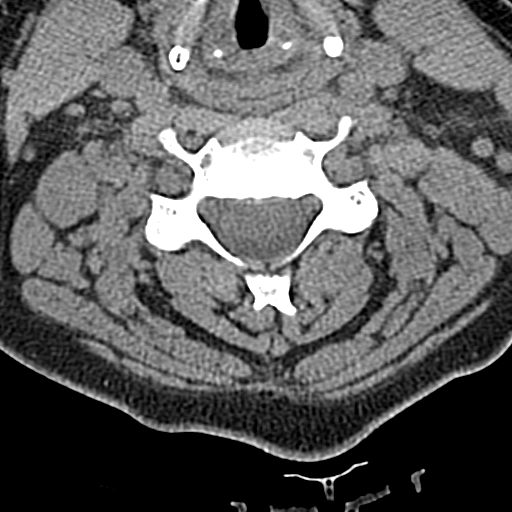
[im 46/92  bone]
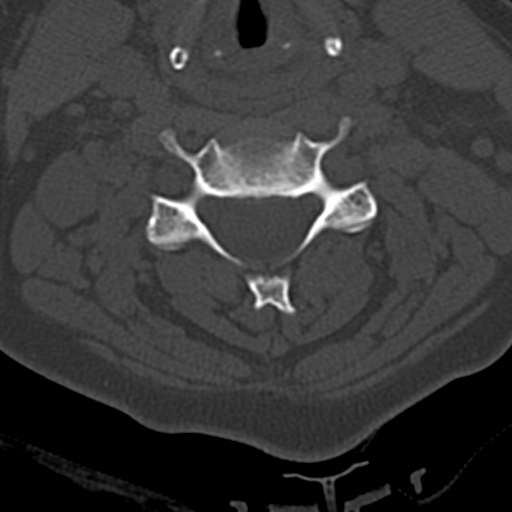

[9 of 33 positions shown; findings below may reference images not displayed]

FINDINGS: Alignment: Normal.

Skull base and vertebrae: No acute fracture. No primary bone lesion
or focal pathologic process.

Soft tissues and spinal canal: No prevertebral fluid or swelling. No
visible canal hematoma.

Disc levels:  Maintained

Upper chest: Negative

Other: None
IMPRESSION: Negative.

## 2023-09-29 DIAGNOSIS — R0981 Nasal congestion: Secondary | ICD-10-CM | POA: Diagnosis not present

## 2023-09-29 DIAGNOSIS — J329 Chronic sinusitis, unspecified: Secondary | ICD-10-CM | POA: Diagnosis not present

## 2023-11-07 DIAGNOSIS — Z79899 Other long term (current) drug therapy: Secondary | ICD-10-CM | POA: Diagnosis not present

## 2023-11-07 DIAGNOSIS — J324 Chronic pansinusitis: Secondary | ICD-10-CM | POA: Diagnosis not present

## 2023-11-07 DIAGNOSIS — J209 Acute bronchitis, unspecified: Secondary | ICD-10-CM | POA: Diagnosis not present

## 2023-11-07 DIAGNOSIS — E669 Obesity, unspecified: Secondary | ICD-10-CM | POA: Diagnosis not present

## 2023-11-07 DIAGNOSIS — Z87891 Personal history of nicotine dependence: Secondary | ICD-10-CM | POA: Diagnosis not present

## 2023-11-17 ENCOUNTER — Emergency Department (HOSPITAL_BASED_OUTPATIENT_CLINIC_OR_DEPARTMENT_OTHER)
Admission: EM | Admit: 2023-11-17 | Discharge: 2023-11-17 | Discharge status: Against Medical Advice | Payer: 59 | Attending: Medical | Admitting: Medical

## 2023-11-17 ENCOUNTER — Emergency Department (HOSPITAL_BASED_OUTPATIENT_CLINIC_OR_DEPARTMENT_OTHER): Payer: 59 | Admitting: Radiology

## 2023-11-17 ENCOUNTER — Other Ambulatory Visit: Payer: Self-pay

## 2023-11-17 DIAGNOSIS — J029 Acute pharyngitis, unspecified: Secondary | ICD-10-CM | POA: Diagnosis not present

## 2023-11-17 DIAGNOSIS — M25512 Pain in left shoulder: Secondary | ICD-10-CM | POA: Diagnosis not present

## 2023-11-17 DIAGNOSIS — Z20822 Contact with and (suspected) exposure to covid-19: Secondary | ICD-10-CM | POA: Diagnosis not present

## 2023-11-17 DIAGNOSIS — R052 Subacute cough: Secondary | ICD-10-CM | POA: Insufficient documentation

## 2023-11-17 DIAGNOSIS — R079 Chest pain, unspecified: Secondary | ICD-10-CM | POA: Diagnosis not present

## 2023-11-17 DIAGNOSIS — R519 Headache, unspecified: Secondary | ICD-10-CM | POA: Diagnosis not present

## 2023-11-17 DIAGNOSIS — R0789 Other chest pain: Secondary | ICD-10-CM | POA: Diagnosis not present

## 2023-11-17 DIAGNOSIS — F419 Anxiety disorder, unspecified: Secondary | ICD-10-CM | POA: Diagnosis not present

## 2023-11-17 DIAGNOSIS — R059 Cough, unspecified: Secondary | ICD-10-CM | POA: Diagnosis not present

## 2023-11-17 DIAGNOSIS — Z5321 Procedure and treatment not carried out due to patient leaving prior to being seen by health care provider: Secondary | ICD-10-CM | POA: Insufficient documentation

## 2023-11-17 LAB — CBC WITH DIFFERENTIAL/PLATELET
Abs Immature Granulocytes: 0.07 10*3/uL (ref 0.00–0.07)
Basophils Absolute: 0.1 10*3/uL (ref 0.0–0.1)
Basophils Relative: 1 %
Eosinophils Absolute: 0.1 10*3/uL (ref 0.0–0.5)
Eosinophils Relative: 1 %
HCT: 37.7 % (ref 36.0–46.0)
Hemoglobin: 12.4 g/dL (ref 12.0–15.0)
Immature Granulocytes: 1 %
Lymphocytes Relative: 25 %
Lymphs Abs: 3.1 10*3/uL (ref 0.7–4.0)
MCH: 27.8 pg (ref 26.0–34.0)
MCHC: 32.9 g/dL (ref 30.0–36.0)
MCV: 84.5 fL (ref 80.0–100.0)
Monocytes Absolute: 0.8 10*3/uL (ref 0.1–1.0)
Monocytes Relative: 7 %
Neutro Abs: 8 10*3/uL — ABNORMAL HIGH (ref 1.7–7.7)
Neutrophils Relative %: 65 %
Platelets: 363 10*3/uL (ref 150–400)
RBC: 4.46 MIL/uL (ref 3.87–5.11)
RDW: 12.9 % (ref 11.5–15.5)
WBC: 12.1 10*3/uL — ABNORMAL HIGH (ref 4.0–10.5)
nRBC: 0 % (ref 0.0–0.2)

## 2023-11-17 LAB — RESP PANEL BY RT-PCR (RSV, FLU A&B, COVID)  RVPGX2
Influenza A by PCR: NEGATIVE
Influenza B by PCR: NEGATIVE
Resp Syncytial Virus by PCR: NEGATIVE
SARS Coronavirus 2 by RT PCR: NEGATIVE

## 2023-11-17 LAB — COMPREHENSIVE METABOLIC PANEL
ALT: 22 U/L (ref 0–44)
AST: 17 U/L (ref 15–41)
Albumin: 4.2 g/dL (ref 3.5–5.0)
Alkaline Phosphatase: 85 U/L (ref 38–126)
Anion gap: 12 (ref 5–15)
BUN: 10 mg/dL (ref 6–20)
CO2: 21 mmol/L — ABNORMAL LOW (ref 22–32)
Calcium: 9.3 mg/dL (ref 8.9–10.3)
Chloride: 104 mmol/L (ref 98–111)
Creatinine, Ser: 0.79 mg/dL (ref 0.44–1.00)
GFR, Estimated: >60 mL/min (ref >60)
Glucose, Bld: 96 mg/dL (ref 70–99)
Potassium: 3.9 mmol/L (ref 3.5–5.1)
Sodium: 137 mmol/L (ref 135–145)
Total Bilirubin: 0.3 mg/dL (ref 0.0–1.2)
Total Protein: 6.6 g/dL (ref 6.5–8.1)

## 2023-11-17 LAB — TROPONIN I (HIGH SENSITIVITY): Troponin I (High Sensitivity): <2 ng/L (ref <18)

## 2023-11-17 LAB — HCG, SERUM, QUALITATIVE: Preg, Serum: NEGATIVE

## 2023-11-17 NOTE — ED Provider Triage Note (Signed)
Emergency Medicine Provider Triage Evaluation Note  LATESA KURTH , a 43 y.o. female  was evaluated in triage.  Pt complains of persistent cough, sore throat and headachex1 month. Reports she has seen pulmonologist, ENT w/o relief. Also states she has L shoulder blade pain. .  Review of Systems  Positive: Shoulder pain Negative: fevers  Physical Exam  BP 126/68 (BP Location: Right Arm)   Pulse (!) 118   Temp 98.2 F (36.8 C)   Resp (!) 21   SpO2 94%  Gen:   Awake, no distress   Resp:  Normal effort  MSK:   Moves extremities without difficulty  Other:    Medical Decision Making  Medically screening exam initiated at 3:30 PM.  Appropriate orders placed.  Berniece Pap was informed that the remainder of the evaluation will be completed by another provider, this initial triage assessment does not replace that evaluation, and the importance of remaining in the ED until their evaluation is complete.     Pete Pelt, [place] 11/17/23 1531

## 2023-12-14 DIAGNOSIS — R0602 Shortness of breath: Secondary | ICD-10-CM | POA: Diagnosis not present

## 2023-12-14 DIAGNOSIS — Z87891 Personal history of nicotine dependence: Secondary | ICD-10-CM | POA: Diagnosis not present

## 2023-12-20 DIAGNOSIS — R21 Rash and other nonspecific skin eruption: Secondary | ICD-10-CM | POA: Diagnosis not present

## 2023-12-20 DIAGNOSIS — R053 Chronic cough: Secondary | ICD-10-CM | POA: Diagnosis not present

## 2023-12-20 DIAGNOSIS — R509 Fever, unspecified: Secondary | ICD-10-CM | POA: Diagnosis not present

## 2023-12-22 DIAGNOSIS — J4540 Moderate persistent asthma, uncomplicated: Secondary | ICD-10-CM | POA: Diagnosis not present

## 2023-12-22 DIAGNOSIS — J418 Mixed simple and mucopurulent chronic bronchitis: Secondary | ICD-10-CM | POA: Diagnosis not present

## 2024-01-16 ENCOUNTER — Telehealth (INDEPENDENT_AMBULATORY_CARE_PROVIDER_SITE_OTHER): Payer: Self-pay | Admitting: Otolaryngology

## 2024-01-16 NOTE — Telephone Encounter (Signed)
 Reminder Call:  Date: 01/17/2024 Status: Sch  Time: 2:50 PM Confirmed time and location-[address] [place], [place] [address]

## 2024-01-17 ENCOUNTER — Institutional Professional Consult (permissible substitution) (INDEPENDENT_AMBULATORY_CARE_PROVIDER_SITE_OTHER): Payer: 59

## 2024-01-17 DIAGNOSIS — L02224 Furuncle of groin: Secondary | ICD-10-CM | POA: Diagnosis not present

## 2024-02-07 DIAGNOSIS — K219 Gastro-esophageal reflux disease without esophagitis: Secondary | ICD-10-CM | POA: Diagnosis not present

## 2024-03-23 DIAGNOSIS — Z130 Encounter for screening for diseases of the blood and blood-forming organs and certain disorders involving the immune mechanism: Secondary | ICD-10-CM | POA: Diagnosis not present

## 2024-03-23 DIAGNOSIS — K219 Gastro-esophageal reflux disease without esophagitis: Secondary | ICD-10-CM | POA: Diagnosis not present

## 2024-03-23 DIAGNOSIS — Z13220 Encounter for screening for lipoid disorders: Secondary | ICD-10-CM | POA: Diagnosis not present

## 2024-03-23 DIAGNOSIS — M25552 Pain in left hip: Secondary | ICD-10-CM | POA: Diagnosis not present

## 2024-03-23 DIAGNOSIS — J4540 Moderate persistent asthma, uncomplicated: Secondary | ICD-10-CM | POA: Diagnosis not present

## 2024-03-23 DIAGNOSIS — R252 Cramp and spasm: Secondary | ICD-10-CM | POA: Diagnosis not present

## 2024-03-23 DIAGNOSIS — Z0000 Encounter for general adult medical examination without abnormal findings: Secondary | ICD-10-CM | POA: Diagnosis not present

## 2024-03-23 DIAGNOSIS — F5101 Primary insomnia: Secondary | ICD-10-CM | POA: Diagnosis not present

## 2024-03-23 DIAGNOSIS — Z23 Encounter for immunization: Secondary | ICD-10-CM | POA: Diagnosis not present

## 2024-03-23 DIAGNOSIS — Z1321 Encounter for screening for nutritional disorder: Secondary | ICD-10-CM | POA: Diagnosis not present

## 2024-03-23 DIAGNOSIS — M25551 Pain in right hip: Secondary | ICD-10-CM | POA: Diagnosis not present

## 2024-03-23 DIAGNOSIS — Z131 Encounter for screening for diabetes mellitus: Secondary | ICD-10-CM | POA: Diagnosis not present

## 2024-03-23 DIAGNOSIS — J418 Mixed simple and mucopurulent chronic bronchitis: Secondary | ICD-10-CM | POA: Diagnosis not present

## 2024-03-23 DIAGNOSIS — F411 Generalized anxiety disorder: Secondary | ICD-10-CM | POA: Diagnosis not present

## 2024-03-27 ENCOUNTER — Encounter (INDEPENDENT_AMBULATORY_CARE_PROVIDER_SITE_OTHER): Payer: Self-pay | Admitting: Otolaryngology

## 2024-03-27 ENCOUNTER — Ambulatory Visit (INDEPENDENT_AMBULATORY_CARE_PROVIDER_SITE_OTHER): Admitting: Otolaryngology

## 2024-03-27 VITALS — BP 121/87 | HR 87 | Ht 70.0 in | Wt 260.0 lb

## 2024-03-27 DIAGNOSIS — Z87891 Personal history of nicotine dependence: Secondary | ICD-10-CM | POA: Diagnosis not present

## 2024-03-27 DIAGNOSIS — R0981 Nasal congestion: Secondary | ICD-10-CM | POA: Diagnosis not present

## 2024-03-27 DIAGNOSIS — J310 Chronic rhinitis: Secondary | ICD-10-CM

## 2024-03-27 DIAGNOSIS — J343 Hypertrophy of nasal turbinates: Secondary | ICD-10-CM

## 2024-03-27 DIAGNOSIS — J324 Chronic pansinusitis: Secondary | ICD-10-CM

## 2024-03-29 DIAGNOSIS — J343 Hypertrophy of nasal turbinates: Secondary | ICD-10-CM | POA: Insufficient documentation

## 2024-03-29 DIAGNOSIS — J324 Chronic pansinusitis: Secondary | ICD-10-CM | POA: Insufficient documentation

## 2024-03-29 NOTE — Progress Notes (Signed)
 CC: Chronic nasal obstruction, recurrent sinusitis  HPI:  Maria Knight is a 43 y.o. female who presents today complaining of chronic nasal obstruction and frequent recurrent sinusitis.  She has been symptomatic for more than 8 years.  She complains of frequent facial pain and pressure, nasal drainage, and nasal congestion.  She was treated with numerous antibiotics and systemic steroid over the past few years.  Her last antibiotic and prednisone  was 2 weeks ago.  She was also treated with Flonase and nasal saline irrigation without improvement in her symptoms.  She has a history of environmental allergies.  She has no previous sinonasal surgery.  Past Medical History:  Diagnosis Date   Anxiety     History reviewed. No pertinent surgical history.  Family History  Problem Relation Age of Onset   Diabetes Mother    Cancer Mother        skin   Hypertension Mother    Diabetes Maternal Grandfather    Heart disease Maternal Grandfather    Stroke Maternal Grandfather     Social History:  reports that she has quit smoking. Her smoking use included cigarettes. She has a 9 pack-year smoking history. She has never used smokeless tobacco. She reports current alcohol use. She reports that she does not use drugs.  Allergies:  Allergies  Allergen Reactions   Latex    Penicillins     Prior to Admission medications   Medication Sig Start Date End Date Taking? Authorizing Provider  ALPRAZolam (XANAX) 0.5 MG tablet Take 0.5 mg by mouth every morning.    [provider]  dicyclomine  (BENTYL ) 20 MG tablet Take 1 tablet (20 mg total) by mouth 2 (two) times daily. 08/23/17   Dansie, William, PA-C  HYDROcodone -acetaminophen  (NORCO/VICODIN) 5-325 MG tablet Take 1 tablet by mouth every 4 (four) hours as needed. Patient not taking: Reported on 03/27/2024 03/08/22   Elisa Guest, PA-C  metroNIDAZOLE  (FLAGYL ) 500 MG tablet Take 1 tablet (500 mg total) by mouth 2 (two) times daily. 08/23/17    Dansie, William, PA-C  polyethylene glycol powder (GLYCOLAX /MIRALAX ) powder Take 17 g by mouth 2 (two) times daily. 08/23/17   Camila Cecil, PA-C    Blood pressure 121/87, pulse 87, height 5\' 10"  (1.778 m), weight 260 lb (117.9 kg). Exam: General: Communicates without difficulty, well nourished, no acute distress. Head: Normocephalic, no evidence injury, no tenderness, facial buttresses intact without stepoff. Face/sinus: No tenderness to palpation and percussion. Facial movement is normal and symmetric. Eyes: PERRL, EOMI. No scleral icterus, conjunctivae clear. Neuro: CN II exam reveals vision grossly intact.  No nystagmus at any point of gaze. Ears: Auricles well formed without lesions.  Ear canals are intact without mass or lesion.  No erythema or edema is appreciated.  The TMs are intact without fluid. Nose: External evaluation reveals normal support and skin without lesions.  Dorsum is intact.  Anterior rhinoscopy reveals congested mucosa over anterior aspect of inferior turbinates and intact septum.  No purulence noted. Oral:  Oral cavity and oropharynx are intact, symmetric, without erythema or edema.  Mucosa is moist without lesions. Neck: Full range of motion without pain.  There is no significant lymphadenopathy.  No masses palpable.  Thyroid bed within normal limits to palpation.  Parotid glands and submandibular glands equal bilaterally without mass.  Trachea is midline. Neuro:  CN 2-12 grossly intact.   Procedure:  Flexible Nasal Endoscopy: Description: Risks, benefits, and alternatives of flexible endoscopy were explained to the patient.  Specific mention  was made of the risk of throat numbness with difficulty swallowing, possible bleeding from the nose and mouth, and pain from the procedure.  The patient gave oral consent to proceed.  The flexible scope was inserted into the right nasal cavity.  Endoscopy of the interior nasal cavity, superior, inferior, and middle meatus was performed. The  sphenoid-ethmoid recess was examined. Edematous mucosa was noted.  No polyp, mass, or lesion was appreciated. Olfactory cleft was clear.  Nasopharynx was clear.  Turbinates were severely hypertrophied but without mass.  The procedure was repeated on the contralateral side with similar findings.  The patient tolerated the procedure well.   Assessment: 1.  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 2.  Nearly 100% of her nasal passageways are obstructed bilaterally.  Plan: 1.  The physical exam and nasal endoscopy findings are reviewed with the patient. 2.  Sinus CT scan to evaluate the severity of her chronic rhinosinusitis. 3.  Continue with Flonase and nasal saline irrigation. 4.  The patient will return for reevaluation after her CT scan.  The patient will likely benefit from surgical intervention with turbinate reduction, with or without endoscopic sinus surgery.  Zriyah Kopplin W Quanell Loughney 03/29/2024, 9:38 AM

## 2024-03-29 NOTE — Addendum Note (Signed)
 Addended byReynold Caves on: 03/29/2024 09:44 AM   Modules accepted: Orders

## 2024-04-17 ENCOUNTER — Ambulatory Visit
Admission: RE | Admit: 2024-04-17 | Discharge: 2024-04-17 | Disposition: A | Discharge status: Home / Self Care | Source: Ambulatory Visit | Attending: Otolaryngology | Admitting: Otolaryngology

## 2024-04-17 DIAGNOSIS — J329 Chronic sinusitis, unspecified: Secondary | ICD-10-CM | POA: Diagnosis not present

## 2024-04-26 ENCOUNTER — Ambulatory Visit (INDEPENDENT_AMBULATORY_CARE_PROVIDER_SITE_OTHER): Admitting: Otolaryngology

## 2024-04-26 ENCOUNTER — Encounter (INDEPENDENT_AMBULATORY_CARE_PROVIDER_SITE_OTHER): Payer: Self-pay | Admitting: Otolaryngology

## 2024-04-26 VITALS — BP 110/78 | HR 90

## 2024-04-26 DIAGNOSIS — J310 Chronic rhinitis: Secondary | ICD-10-CM | POA: Diagnosis not present

## 2024-04-26 DIAGNOSIS — R0981 Nasal congestion: Secondary | ICD-10-CM | POA: Diagnosis not present

## 2024-04-26 DIAGNOSIS — J342 Deviated nasal septum: Secondary | ICD-10-CM | POA: Diagnosis not present

## 2024-04-26 DIAGNOSIS — J343 Hypertrophy of nasal turbinates: Secondary | ICD-10-CM | POA: Diagnosis not present

## 2024-04-27 DIAGNOSIS — J342 Deviated nasal septum: Secondary | ICD-10-CM | POA: Insufficient documentation

## 2024-04-27 DIAGNOSIS — J310 Chronic rhinitis: Secondary | ICD-10-CM | POA: Insufficient documentation

## 2024-04-27 NOTE — Progress Notes (Signed)
 Patient ID: Maria Knight, female   DOB: [birth year], 43 y.o.   MRN: [id number]  Follow-up: Chronic nasal obstruction, recurrent sinusitis  HPI: The patient is a 43 year old female who returns today for follow-up evaluation.  The patient was last seen in June 2025.  At that time, she was complaining of chronic nasal obstruction and recurrent sinusitis.  She was previously treated with multiple courses of antibiotics, systemic steroids, Flonase nasal spray, nasal saline irrigation, and allergy medications without improvement in her symptoms.  She was noted to have nasal septal deviation and bilateral inferior turbinate hypertrophy.  A subsequent CT scan showed no significant acute or chronic sinusitis.  However, her nasal cavities were severely obstructed by the nasal septal deviation and turbinate hypertrophy.  The patient returns today complaining of persistent nasal obstruction and facial pressure.  She denies any fever or visual change.  Exam: General: Communicates without difficulty, well nourished, no acute distress. Head: Normocephalic, no evidence injury, no tenderness, facial buttresses intact without stepoff. Face/sinus: No tenderness to palpation and percussion. Facial movement is normal and symmetric. Eyes: PERRL, EOMI. No scleral icterus, conjunctivae clear. Neuro: CN II exam reveals vision grossly intact.  No nystagmus at any point of gaze. Ears: Auricles well formed without lesions.  Ear canals are intact without mass or lesion.  No erythema or edema is appreciated.  The TMs are intact without fluid. Nose: External evaluation reveals normal support and skin without lesions.  Dorsum is intact.  Anterior rhinoscopy reveals congested mucosa over anterior aspect of inferior turbinates and deviated septum.  No purulence noted. Oral:  Oral cavity and oropharynx are intact, symmetric, without erythema or edema.  Mucosa is moist without lesions. Neck: Full range of motion without pain.  There is no  significant lymphadenopathy.  No masses palpable.  Thyroid bed within normal limits to palpation.  Parotid glands and submandibular glands equal bilaterally without mass.  Trachea is midline. Neuro:  CN 2-12 grossly intact.   Assessment: 1.  Chronic rhinitis with nasal mucosal congestion, nasal septal deviation, and bilateral severe inferior turbinate hypertrophy. 2.  Nearly 100% of the nasal passageways are obstructed bilaterally. 3.  Her recent sinus CT scan showed no significant acute or chronic sinusitis. 4.  The patient has not responded to medical treatment for the past 8 years.  Plan: 1.  The physical exam findings and the CT images are extensively reviewed with the patient. 2.  Continue with Flonase nasal spray and nasal saline irrigation daily. 3.  In light of her persistent obstruction, she will likely benefit from surgical intervention with septoplasty and bilateral turbinate reduction.  The risk, benefits, alternatives, and details of the procedures are extensively reviewed.  Questions are invited and answered. 4.  The patient would like to proceed with the procedures.  We will schedule the procedure in accordance with the patient's schedule. 2.

## 2024-05-07 ENCOUNTER — Encounter (HOSPITAL_BASED_OUTPATIENT_CLINIC_OR_DEPARTMENT_OTHER): Payer: Self-pay | Admitting: Otolaryngology

## 2024-05-07 ENCOUNTER — Other Ambulatory Visit: Payer: Self-pay

## 2024-05-10 NOTE — Anesthesia Preprocedure Evaluation (Addendum)
 Anesthesia Evaluation  Patient identified by MRN, date of birth, ID band Patient awake    Reviewed: Allergy & Precautions, NPO status , Patient's Chart, lab work & pertinent test results  History of Anesthesia Complications Negative for: history of anesthetic complications  Airway Mallampati: I  TM Distance: >3 FB Neck ROM: Full    Dental  (+) Dental Advisory Given, Missing   Pulmonary neg shortness of breath, asthma , neg sleep apnea, COPD, neg recent URI, Patient abstained from smoking., former smoker   Pulmonary exam normal breath sounds clear to auscultation       Cardiovascular negative cardio ROS  Rhythm:Regular Rate:Normal     Neuro/Psych  PSYCHIATRIC DISORDERS Anxiety Depression    negative neurological ROS     GI/Hepatic Neg liver ROS,GERD  Medicated,,  Endo/Other  Pre-diabetes  Renal/GU negative Renal ROS     Musculoskeletal   Abdominal  (+) + obese  Peds  Hematology negative hematology ROS (+) Lab Results      Component                Value               Date                      WBC                      12.1 (H)            11/17/2023                HGB                      12.4                11/17/2023                HCT                      37.7                11/17/2023                MCV                      84.5                11/17/2023                PLT                      363                 11/17/2023              Anesthesia Other Findings Nasal turbinate hypertrophy, deviated nasal septum  Reproductive/Obstetrics                              Anesthesia Physical Anesthesia Plan  ASA: 2  Anesthesia Plan: General   Post-op Pain Management: Tylenol  PO (pre-op)*   Induction: Intravenous  PONV Risk Score and Plan: 3 and Ondansetron , Dexamethasone , Midazolam  and Treatment may vary due to age or medical condition  Airway Management Planned: Oral  ETT  Additional Equipment:   Intra-op Plan:   Post-operative Plan: Extubation in OR  Informed Consent: I  have reviewed the patients History and Physical, chart, labs and discussed the procedure including the risks, benefits and alternatives for the proposed anesthesia with the patient or authorized representative who has indicated his/her understanding and acceptance.     Dental advisory given  Plan Discussed with: CRNA and Anesthesiologist  Anesthesia Plan Comments: (Risks of general anesthesia discussed including, but not limited to, sore throat, hoarse voice, chipped/damaged teeth, injury to vocal cords, nausea and vomiting, allergic reactions, lung infection, heart attack, stroke, and death. All questions answered. )         Anesthesia Quick Evaluation

## 2024-05-14 ENCOUNTER — Ambulatory Visit (HOSPITAL_BASED_OUTPATIENT_CLINIC_OR_DEPARTMENT_OTHER): Payer: Self-pay | Admitting: Anesthesiology

## 2024-05-14 ENCOUNTER — Encounter (HOSPITAL_BASED_OUTPATIENT_CLINIC_OR_DEPARTMENT_OTHER): Payer: Self-pay | Admitting: Otolaryngology

## 2024-05-14 ENCOUNTER — Encounter (HOSPITAL_BASED_OUTPATIENT_CLINIC_OR_DEPARTMENT_OTHER)
Admission: RE | Disposition: A | Discharge status: Home / Self Care | Payer: Self-pay | Source: Home / Self Care | Attending: Otolaryngology

## 2024-05-14 ENCOUNTER — Other Ambulatory Visit: Payer: Self-pay

## 2024-05-14 ENCOUNTER — Ambulatory Visit (HOSPITAL_BASED_OUTPATIENT_CLINIC_OR_DEPARTMENT_OTHER)
Admission: RE | Admit: 2024-05-14 | Discharge: 2024-05-14 | Disposition: A | Discharge status: Home / Self Care | Attending: Otolaryngology | Admitting: Otolaryngology

## 2024-05-14 DIAGNOSIS — R7303 Prediabetes: Secondary | ICD-10-CM | POA: Insufficient documentation

## 2024-05-14 DIAGNOSIS — J4489 Other specified chronic obstructive pulmonary disease: Secondary | ICD-10-CM | POA: Insufficient documentation

## 2024-05-14 DIAGNOSIS — J3489 Other specified disorders of nose and nasal sinuses: Secondary | ICD-10-CM | POA: Insufficient documentation

## 2024-05-14 DIAGNOSIS — J342 Deviated nasal septum: Secondary | ICD-10-CM | POA: Insufficient documentation

## 2024-05-14 DIAGNOSIS — J343 Hypertrophy of nasal turbinates: Secondary | ICD-10-CM | POA: Diagnosis not present

## 2024-05-14 DIAGNOSIS — Z01818 Encounter for other preprocedural examination: Secondary | ICD-10-CM

## 2024-05-14 HISTORY — DX: Depression, unspecified: F32.A

## 2024-05-14 HISTORY — DX: Prediabetes: R73.03

## 2024-05-14 HISTORY — DX: Unspecified asthma, uncomplicated: J45.909

## 2024-05-14 HISTORY — DX: Chronic obstructive pulmonary disease, unspecified: J44.9

## 2024-05-14 HISTORY — PX: NASAL SEPTOPLASTY W/ TURBINOPLASTY: SHX2070

## 2024-05-14 LAB — POCT PREGNANCY, URINE: Preg Test, Ur: NEGATIVE

## 2024-05-14 SURGERY — SEPTOPLASTY, NOSE, WITH NASAL TURBINATE REDUCTION
Anesthesia: General | Site: Nose | Laterality: Bilateral

## 2024-05-14 MED ORDER — PROPOFOL 10 MG/ML IV BOLUS
INTRAVENOUS | Status: DC | PRN
  Administered 2024-05-14: 200 mg via INTRAVENOUS

## 2024-05-14 MED ORDER — OXYCODONE HCL 5 MG/5ML PO SOLN: 5.0000 mg | Freq: Once | ORAL | Status: AC | PRN

## 2024-05-14 MED ORDER — AMISULPRIDE (ANTIEMETIC) 5 MG/2ML IV SOLN
INTRAVENOUS | Status: AC
  Filled 2024-05-14: qty 4

## 2024-05-14 MED ORDER — MIDAZOLAM HCL 5 MG/5ML IJ SOLN
INTRAMUSCULAR | Status: DC | PRN
  Administered 2024-05-14: 2 mg via INTRAVENOUS

## 2024-05-14 MED ORDER — LACTATED RINGERS IV SOLN: INTRAVENOUS | Status: DC

## 2024-05-14 MED ORDER — MUPIROCIN 2 % EX OINT
TOPICAL_OINTMENT | CUTANEOUS | Status: DC | PRN
  Administered 2024-05-14: 1 via NASAL

## 2024-05-14 MED ORDER — CEFAZOLIN SODIUM-DEXTROSE 2-3 GM-%(50ML) IV SOLR
INTRAVENOUS | Status: DC | PRN
Start: 2024-05-14 — End: 2024-05-14
  Administered 2024-05-14: 2 g via INTRAVENOUS

## 2024-05-14 MED ORDER — FENTANYL CITRATE (PF) 100 MCG/2ML IJ SOLN
INTRAMUSCULAR | Status: AC
  Filled 2024-05-14: qty 2

## 2024-05-14 MED ORDER — DEXAMETHASONE SODIUM PHOSPHATE 4 MG/ML IJ SOLN
INTRAMUSCULAR | Status: DC | PRN
  Administered 2024-05-14: 10 mg via INTRAVENOUS

## 2024-05-14 MED ORDER — OXYMETAZOLINE HCL 0.05 % NA SOLN
NASAL | Status: AC
  Filled 2024-05-14: qty 30

## 2024-05-14 MED ORDER — FENTANYL CITRATE (PF) 100 MCG/2ML IJ SOLN
25.0000 ug | INTRAMUSCULAR | Status: DC | PRN
  Administered 2024-05-14: 50 ug via INTRAVENOUS

## 2024-05-14 MED ORDER — OXYCODONE HCL 5 MG PO TABS
ORAL_TABLET | ORAL | Status: AC
  Filled 2024-05-14: qty 1

## 2024-05-14 MED ORDER — SCOPOLAMINE 1 MG/3DAYS TD PT72
MEDICATED_PATCH | TRANSDERMAL | Status: AC
  Filled 2024-05-14: qty 1

## 2024-05-14 MED ORDER — MIDAZOLAM HCL 2 MG/2ML IJ SOLN
INTRAMUSCULAR | Status: AC
  Filled 2024-05-14: qty 2

## 2024-05-14 MED ORDER — ONDANSETRON HCL 4 MG/2ML IJ SOLN
INTRAMUSCULAR | Status: DC | PRN
  Administered 2024-05-14: 4 mg via INTRAVENOUS

## 2024-05-14 MED ORDER — CEFAZOLIN SODIUM-DEXTROSE 2-4 GM/100ML-% IV SOLN
INTRAVENOUS | Status: AC
  Filled 2024-05-14: qty 100

## 2024-05-14 MED ORDER — AMISULPRIDE (ANTIEMETIC) 5 MG/2ML IV SOLN
10.0000 mg | Freq: Once | INTRAVENOUS | Status: AC | PRN
  Administered 2024-05-14: 10 mg via INTRAVENOUS

## 2024-05-14 MED ORDER — PROPOFOL 500 MG/50ML IV EMUL
INTRAVENOUS | Status: AC
  Filled 2024-05-14: qty 50

## 2024-05-14 MED ORDER — OXYCODONE HCL 5 MG PO TABS
5.0000 mg | ORAL_TABLET | Freq: Once | ORAL | Status: AC | PRN
  Administered 2024-05-14: 5 mg via ORAL

## 2024-05-14 MED ORDER — ROCURONIUM BROMIDE 100 MG/10ML IV SOLN
INTRAVENOUS | Status: DC | PRN
  Administered 2024-05-14: 60 mg via INTRAVENOUS

## 2024-05-14 MED ORDER — LIDOCAINE 2% (20 MG/ML) 5 ML SYRINGE
INTRAMUSCULAR | Status: AC
  Filled 2024-05-14: qty 5

## 2024-05-14 MED ORDER — FENTANYL CITRATE (PF) 100 MCG/2ML IJ SOLN
INTRAMUSCULAR | Status: DC | PRN
  Administered 2024-05-14 (×4): 50 ug via INTRAVENOUS

## 2024-05-14 MED ORDER — ACETAMINOPHEN 500 MG PO TABS
ORAL_TABLET | ORAL | Status: AC
  Filled 2024-05-14: qty 2

## 2024-05-14 MED ORDER — MUPIROCIN 2 % EX OINT
TOPICAL_OINTMENT | CUTANEOUS | Status: AC
Start: 2024-05-14 — End: 2024-05-14
  Filled 2024-05-14: qty 22

## 2024-05-14 MED ORDER — LIDOCAINE-EPINEPHRINE 1 %-1:100000 IJ SOLN
INTRAMUSCULAR | Status: AC
  Filled 2024-05-14: qty 1

## 2024-05-14 MED ORDER — DEXAMETHASONE SODIUM PHOSPHATE 10 MG/ML IJ SOLN
INTRAMUSCULAR | Status: AC
  Filled 2024-05-14: qty 1

## 2024-05-14 MED ORDER — SUGAMMADEX SODIUM 200 MG/2ML IV SOLN
INTRAVENOUS | Status: DC | PRN
  Administered 2024-05-14: 200 mg via INTRAVENOUS

## 2024-05-14 MED ORDER — OXYMETAZOLINE HCL 0.05 % NA SOLN
NASAL | Status: DC | PRN
  Administered 2024-05-14: 1 via TOPICAL

## 2024-05-14 MED ORDER — SCOPOLAMINE 1 MG/3DAYS TD PT72
1.0000 | MEDICATED_PATCH | TRANSDERMAL | Status: DC
  Administered 2024-05-14: 1.5 mg via TRANSDERMAL

## 2024-05-14 MED ORDER — ROCURONIUM BROMIDE 10 MG/ML (PF) SYRINGE
PREFILLED_SYRINGE | INTRAVENOUS | Status: AC
  Filled 2024-05-14: qty 10

## 2024-05-14 MED ORDER — ACETAMINOPHEN 500 MG PO TABS
1000.0000 mg | ORAL_TABLET | Freq: Once | ORAL | Status: AC
  Administered 2024-05-14: 1000 mg via ORAL

## 2024-05-14 MED ORDER — LIDOCAINE-EPINEPHRINE 1 %-1:100000 IJ SOLN
INTRAMUSCULAR | Status: DC | PRN
Start: 2024-05-14 — End: 2024-05-14
  Administered 2024-05-14: 6 mL

## 2024-05-14 MED ORDER — ONDANSETRON HCL 4 MG/2ML IJ SOLN
INTRAMUSCULAR | Status: AC
  Filled 2024-05-14: qty 2

## 2024-05-14 SURGICAL SUPPLY — 28 items
CANISTER SUCT 1200ML W/VALVE (MISCELLANEOUS) ×1 IMPLANT
COAGULATOR SUCT 8FR VV (MISCELLANEOUS) ×1 IMPLANT
DEFOGGER MIRROR 1QT (MISCELLANEOUS) ×1 IMPLANT
DRSG NASOPORE 8CM (GAUZE/BANDAGES/DRESSINGS) IMPLANT
DRSG TELFA 3X8 NADH STRL (GAUZE/BANDAGES/DRESSINGS) IMPLANT
ELECTRODE REM PT RTRN 9FT ADLT (ELECTROSURGICAL) ×1 IMPLANT
GAUZE SPONGE 2X2 STRL 8-PLY (GAUZE/BANDAGES/DRESSINGS) ×1 IMPLANT
GLOVE SURG SS PI 6.5 STRL IVOR (GLOVE) IMPLANT
GLOVE SURG SS PI 7.5 STRL IVOR (GLOVE) IMPLANT
GOWN STRL REUS W/ TWL LRG LVL3 (GOWN DISPOSABLE) ×2 IMPLANT
NDL HYPO 25X1 1.5 SAFETY (NEEDLE) ×1 IMPLANT
NEEDLE HYPO 25X1 1.5 SAFETY (NEEDLE) ×1 IMPLANT
NS IRRIG 1000ML POUR BTL (IV SOLUTION) ×1 IMPLANT
PACK BASIN DAY SURGERY FS (CUSTOM PROCEDURE TRAY) ×1 IMPLANT
PACK ENT DAY SURGERY (CUSTOM PROCEDURE TRAY) ×1 IMPLANT
PAD MAGNETIC INSTR ST 16X20 (MISCELLANEOUS) IMPLANT
SLEEVE SCD COMPRESS KNEE MED (STOCKING) IMPLANT
SPIKE FLUID TRANSFER (MISCELLANEOUS) IMPLANT
SPLINT NASAL AIRWAY SILICONE (MISCELLANEOUS) ×1 IMPLANT
SPONGE NEURO XRAY DETECT 1X3 (DISPOSABLE) ×1 IMPLANT
SUT CHROMIC 4 0 P 3 18 (SUTURE) ×1 IMPLANT
SUT PLAIN 4 0 ~~LOC~~ 1 (SUTURE) ×1 IMPLANT
SUT PROLENE 3 0 PS 2 (SUTURE) ×1 IMPLANT
SUT VIC AB 4-0 P-3 18XBRD (SUTURE) IMPLANT
TOWEL GREEN STERILE FF (TOWEL DISPOSABLE) ×1 IMPLANT
TUBE SALEM SUMP 12FR 48 (TUBING) IMPLANT
TUBE SALEM SUMP 16F (TUBING) ×1 IMPLANT
YANKAUER SUCT BULB TIP NO VENT (SUCTIONS) ×1 IMPLANT

## 2024-05-14 NOTE — Anesthesia Postprocedure Evaluation (Signed)
 Anesthesia Post Note  Patient: Maria Knight  Procedure(s) Performed: SEPTOPLASTY, NOSE, WITH NASAL TURBINATE REDUCTION (Bilateral: Nose)     Patient location during evaluation: PACU Anesthesia Type: General Level of consciousness: awake Pain management: pain level controlled Vital Signs Assessment: post-procedure vital signs reviewed and stable Respiratory status: spontaneous breathing, nonlabored ventilation and respiratory function stable Cardiovascular status: blood pressure returned to baseline and stable Postop Assessment: no apparent nausea or vomiting Anesthetic complications: no   No notable events documented.  Last Vitals:  Vitals:   05/14/24 0945 05/14/24 1000  BP: (!) 143/96 132/86  Pulse: 92 90  Resp: 14 18  Temp:    SpO2: 96% 93%    Last Pain:  Vitals:   05/14/24 0945  TempSrc:   PainSc: 4                  Delon Aisha Arch

## 2024-05-14 NOTE — Op Note (Signed)
 DATE OF PROCEDURE: 05/14/2024  OPERATIVE REPORT   SURGEON: Daniel Moccasin, MD   PREOPERATIVE DIAGNOSES:  1. Severe nasal septal deviation.  2. Bilateral inferior turbinate hypertrophy.  3. Chronic nasal obstruction.  POSTOPERATIVE DIAGNOSES:  1. Severe nasal septal deviation.  2. Bilateral inferior turbinate hypertrophy.  3. Chronic nasal obstruction.  PROCEDURE PERFORMED:  1. Septoplasty.  2. Bilateral partial inferior turbinate resection.   ANESTHESIA: General endotracheal tube anesthesia.   COMPLICATIONS: None.   ESTIMATED BLOOD LOSS: 50 mL.   INDICATION FOR PROCEDURE: Maria Knight is a 43 y.o. female with a history of chronic nasal obstruction. The patient was treated with antihistamine, decongestant, and steroid nasal sprays. However, the patient continued to be symptomatic. On examination, the patient was noted to have bilateral severe inferior turbinate hypertrophy and significant nasal septal deviation, causing significant nasal obstruction. Based on the above findings, the decision was made for the patient to undergo the above-stated procedures. The risks, benefits, alternatives, and details of the procedures were discussed with the patient. Questions were invited and answered. Informed consent was obtained.   DESCRIPTION OF PROCEDURE: The patient was taken to the operating room and placed supine on the operating table. General endotracheal tube anesthesia was administered by the anesthesiologist. The patient was positioned, and prepped and draped in the standard fashion for nasal surgery. Pledgets soaked with Afrin were placed in both nasal cavities for decongestion. The pledgets were subsequently removed.   Examination of the nasal cavity revealed a severe nasal septal deviation. 1% lidocaine  with 1:100,000 epinephrine  was injected onto the nasal septum bilaterally. A hemitransfixion incision was made on the left side. The mucosal flap was carefully elevated on the left side. A  cartilaginous incision was made 1 cm superior to the caudal margin of the nasal septum. Mucosal flap was also elevated on the right side in the similar fashion. It should be noted that due to the severe septal deviation, the deviated portion of the cartilaginous and bony septum had to be removed in piecemeal fashion. Once the deviated portions were removed, a straight midline septum was achieved. The septum was then quilted with 4-0 plain gut sutures. The hemitransfixion incision was closed with interrupted 4-0 chromic sutures.   The inferior one half of both hypertrophied inferior turbinate was crossclamped with a Kelly clamp. The inferior one half of each inferior turbinate was then resected with a pair of cross cutting scissors. Hemostasis was achieved with a suction cautery device. Doyle splints were applied to the nasal septum.  The care of the patient was turned over to the anesthesiologist. The patient was awakened from anesthesia without difficulty. The patient was extubated and transferred to the recovery room in good condition.   OPERATIVE FINDINGS: Severe nasal septal deviation and bilateral inferior turbinate hypertrophy.   SPECIMEN: None.   FOLLOWUP CARE: The patient be discharged home once she is awake and alert.  The patient will follow up in my office in 3 days for splint removal.   Zekiel Torian Dois Moccasin, MD

## 2024-05-14 NOTE — Transfer of Care (Signed)
 Immediate Anesthesia Transfer of Care Note  Patient: Maria Knight  Procedure(s) Performed: SEPTOPLASTY, NOSE, WITH NASAL TURBINATE REDUCTION (Bilateral: Nose)  Patient Location: PACU  Anesthesia Type:General  Level of Consciousness: awake, alert , and oriented  Airway & Oxygen Therapy: Patient Spontanous Breathing and Patient connected to face mask oxygen  Post-op Assessment: Report given to RN and Post -op Vital signs reviewed and stable  Post vital signs: Reviewed and stable  Last Vitals:  Vitals Value Taken Time  BP    Temp    Pulse 99 05/14/24 09:06  Resp 14 05/14/24 09:06  SpO2 91 % 05/14/24 09:06  Vitals shown include unfiled device data.  Last Pain:  Vitals:   05/14/24 0634  TempSrc: Temporal  PainSc: 4          Complications: No notable events documented.

## 2024-05-14 NOTE — Anesthesia Procedure Notes (Signed)
 Procedure Name: Intubation Date/Time: 05/14/2024 7:48 AM  Performed by: Julieanne Fairy BROCKS, CRNAPre-anesthesia Checklist: Patient identified, Emergency Drugs available, Suction available and Patient being monitored Patient Re-evaluated:Patient Re-evaluated prior to induction Oxygen Delivery Method: Circle system utilized Preoxygenation: Pre-oxygenation with 100% oxygen Induction Type: IV induction Ventilation: Mask ventilation without difficulty Laryngoscope Size: Mac and 4 Grade View: Grade I Tube type: Oral Tube size: 7.0 mm Number of attempts: 1 Airway Equipment and Method: Stylet and Oral airway Placement Confirmation: ETT inserted through vocal cords under direct vision, positive ETCO2 and breath sounds checked- equal and bilateral Secured at: 22 cm Tube secured with: Tape Dental Injury: Teeth and Oropharynx as per pre-operative assessment

## 2024-05-14 NOTE — Discharge Instructions (Addendum)
 POSTOPERATIVE INSTRUCTIONS FOR PATIENTS HAVING NASAL OR SINUS OPERATIONS ACTIVITY: Restrict activity at home for the first two days, resting as much as possible. Light activity is best. You may usually return to work within a week. You should refrain from nose blowing, strenuous activity, or heavy lifting greater than 20lbs for a total of one week after your operation.  If sneezing cannot be avoided, sneeze with your mouth open. DISCOMFORT: You may experience a dull headache and pressure along with nasal congestion and discharge. These symptoms may be worse during the first week after the operation but may last as long as two to four weeks.  Please take Tylenol  or the pain medication that has been prescribed for you. Do not take aspirin or aspirin containing medications since they may cause bleeding.  You may experience symptoms of post nasal drainage, nasal congestion, headaches and fatigue for two or three months after your operation.  BLEEDING: You may have some blood tinged nasal drainage for approximately two weeks after the operation.  The discharge will be worse for the first week.  Please call our office at [phone] or go to the nearest hospital emergency room if you experience any of the following: heavy, bright red blood from your nose or mouth that lasts longer than 15 minutes or coughing up or vomiting bright red blood or blood clots. GENERAL CONSIDERATIONS: A gauze dressing will be placed on your upper lip to absorb any drainage after the operation. You may need to change this several times a day.  If you do not have very much drainage, you may remove the dressing.  Remember that you may gently wipe your nose with a tissue and sniff in, but DO NOT blow your nose. Please keep all of your postoperative appointments.  Your final results after the operation will depend on proper follow-up.  The initial visit is usually 2 to 5 days after the operation.  During this visit, the remaining nasal  packing and internal septal splints will be removed.  Your nasal and sinus cavities will be cleaned.  During the second visit, your nasal and sinus cavities will be cleaned again. Have someone drive you to your first two postoperative appointments.  How you care for your nose after the operation will influence the results that you obtain.  You should follow all directions, take your medication as prescribed, and call our office [phone] with any problems or questions. You may be more comfortable sleeping with your head elevated on two pillows. Do not take any medications that we have not prescribed or recommended. WARNING SIGNS: if any of the following should occur, please call our office: Persistent fever greater than 102F. Persistent vomiting. Severe and constant pain that is not relieved by prescribed pain medication. Trauma to the nose. Rash or unusual side effects from any medicines.     Post Anesthesia Home Care Instructions  Activity: Get plenty of rest for the remainder of the day. A responsible individual must stay with you for 24 hours following the procedure.  For the next 24 hours, DO NOT: -Drive a car -Advertising copywriter -Drink alcoholic beverages -Take any medication unless instructed by your physician -Make any legal decisions or sign important papers.  Meals: Start with liquid foods such as gelatin or soup. Progress to regular foods as tolerated. Avoid greasy, spicy, heavy foods. If nausea and/or vomiting occur, drink only clear liquids until the nausea and/or vomiting subsides. Call your physician if vomiting continues.  Special Instructions/Symptoms: Your throat may feel  dry or sore from the anesthesia or the breathing tube placed in your throat during surgery. If this causes discomfort, gargle with warm salt water. The discomfort should disappear within 24 hours.  If you had a scopolamine  patch placed behind your ear for the management of post- operative nausea  and/or vomiting:  1. The medication in the patch is effective for 72 hours, after which it should be removed.  Wrap patch in a tissue and discard in the trash. Wash hands thoroughly with soap and water. 2. You may remove the patch earlier than 72 hours if you experience unpleasant side effects which may include dry mouth, dizziness or visual disturbances. 3. Avoid touching the patch. Wash your hands with soap and water after contact with the patch.     Next dose of Tylenol  may be given 12:36pm if needed.

## 2024-05-14 NOTE — H&P (Signed)
 Cc: Chronic nasal obstruction   HPI: The patient is a 43 year old female who returns today for follow-up evaluation.  The patient was last seen in June 2025.  At that time, she was complaining of chronic nasal obstruction and recurrent sinusitis.  She was previously treated with multiple courses of antibiotics, systemic steroids, Flonase nasal spray, nasal saline irrigation, and allergy medications without improvement in her symptoms.  She was noted to have nasal septal deviation and bilateral inferior turbinate hypertrophy.  A subsequent CT scan showed no significant acute or chronic sinusitis.  However, her nasal cavities were severely obstructed by the nasal septal deviation and turbinate hypertrophy.  The patient returns today complaining of persistent nasal obstruction and facial pressure.  She denies any fever or visual change.   Exam: General: Communicates without difficulty, well nourished, no acute distress. Head: Normocephalic, no evidence injury, no tenderness, facial buttresses intact without stepoff. Face/sinus: No tenderness to palpation and percussion. Facial movement is normal and symmetric. Eyes: PERRL, EOMI. No scleral icterus, conjunctivae clear. Neuro: CN II exam reveals vision grossly intact.  No nystagmus at any point of gaze. Ears: Auricles well formed without lesions.  Ear canals are intact without mass or lesion.  No erythema or edema is appreciated.  The TMs are intact without fluid. Nose: External evaluation reveals normal support and skin without lesions.  Dorsum is intact.  Anterior rhinoscopy reveals congested mucosa over anterior aspect of inferior turbinates and deviated septum.  No purulence noted. Oral:  Oral cavity and oropharynx are intact, symmetric, without erythema or edema.  Mucosa is moist without lesions. Neck: Full range of motion without pain.  There is no significant lymphadenopathy.  No masses palpable.  Thyroid bed within normal limits to palpation.  Parotid  glands and submandibular glands equal bilaterally without mass.  Trachea is midline. Neuro:  CN 2-12 grossly intact.    Assessment: 1.  Chronic rhinitis with nasal mucosal congestion, nasal septal deviation, and bilateral severe inferior turbinate hypertrophy. 2.  Nearly 100% of the nasal passageways are obstructed bilaterally. 3.  Her recent sinus CT scan showed no significant acute or chronic sinusitis. 4.  The patient has not responded to medical treatment for the past 8 years.   Plan: 1.  The physical exam findings and the CT images are extensively reviewed with the patient. 2.  Continue with Flonase nasal spray and nasal saline irrigation daily. 3.  In light of her persistent obstruction, she will likely benefit from surgical intervention with septoplasty and bilateral turbinate reduction.  The risk, benefits, alternatives, and details of the procedures are extensively reviewed.  Questions are invited and answered. 4.  The patient would like to proceed with the procedures.

## 2024-05-15 ENCOUNTER — Encounter (HOSPITAL_BASED_OUTPATIENT_CLINIC_OR_DEPARTMENT_OTHER): Payer: Self-pay | Admitting: Otolaryngology

## 2024-05-17 ENCOUNTER — Encounter (INDEPENDENT_AMBULATORY_CARE_PROVIDER_SITE_OTHER): Payer: Self-pay | Admitting: Otolaryngology

## 2024-05-17 ENCOUNTER — Ambulatory Visit (INDEPENDENT_AMBULATORY_CARE_PROVIDER_SITE_OTHER): Admitting: Otolaryngology

## 2024-05-17 VITALS — BP 145/86 | HR 87

## 2024-05-17 DIAGNOSIS — Z9889 Other specified postprocedural states: Secondary | ICD-10-CM

## 2024-05-17 DIAGNOSIS — J310 Chronic rhinitis: Secondary | ICD-10-CM

## 2024-05-17 NOTE — Progress Notes (Signed)
 Doyle splints removed. Septum and turbinates are healing well.   Both [place] debrided.  Nasal saline irrigation.  Recheck in 3 weeks.

## 2024-06-12 ENCOUNTER — Encounter (INDEPENDENT_AMBULATORY_CARE_PROVIDER_SITE_OTHER): Payer: Self-pay | Admitting: Otolaryngology

## 2024-06-12 ENCOUNTER — Ambulatory Visit (INDEPENDENT_AMBULATORY_CARE_PROVIDER_SITE_OTHER): Admitting: Otolaryngology

## 2024-06-12 VITALS — BP 140/96 | HR 80

## 2024-06-12 DIAGNOSIS — J324 Chronic pansinusitis: Secondary | ICD-10-CM

## 2024-06-12 DIAGNOSIS — Z9889 Other specified postprocedural states: Secondary | ICD-10-CM

## 2024-06-12 MED ORDER — LEVOFLOXACIN 500 MG PO TABS: 500.0000 mg | ORAL_TABLET | Freq: Every day | ORAL | 0 refills | Status: AC

## 2024-06-12 NOTE — Progress Notes (Signed)
 Patient complaining of persistent sinusitis since the surgery.  She has frequent facial pain and pressure.    Septum and turbinates are healing well.   Both [place] debrided.   Nasal saline irrigation.  Levofloxacin  for 10 days.   Recheck in 3 months, sooner if needed.

## 2024-06-21 DIAGNOSIS — Z1211 Encounter for screening for malignant neoplasm of colon: Secondary | ICD-10-CM | POA: Diagnosis not present

## 2024-06-21 DIAGNOSIS — R198 Other specified symptoms and signs involving the digestive system and abdomen: Secondary | ICD-10-CM | POA: Diagnosis not present

## 2024-06-21 DIAGNOSIS — K219 Gastro-esophageal reflux disease without esophagitis: Secondary | ICD-10-CM | POA: Diagnosis not present

## 2024-06-21 DIAGNOSIS — R7989 Other specified abnormal findings of blood chemistry: Secondary | ICD-10-CM | POA: Diagnosis not present

## 2024-06-21 DIAGNOSIS — R1314 Dysphagia, pharyngoesophageal phase: Secondary | ICD-10-CM | POA: Diagnosis not present

## 2024-07-30 DIAGNOSIS — R197 Diarrhea, unspecified: Secondary | ICD-10-CM | POA: Diagnosis not present

## 2024-07-30 DIAGNOSIS — R112 Nausea with vomiting, unspecified: Secondary | ICD-10-CM | POA: Diagnosis not present

## 2024-07-30 DIAGNOSIS — J329 Chronic sinusitis, unspecified: Secondary | ICD-10-CM | POA: Diagnosis not present

## 2024-08-15 DIAGNOSIS — K635 Polyp of colon: Secondary | ICD-10-CM | POA: Diagnosis not present

## 2024-08-15 DIAGNOSIS — R198 Other specified symptoms and signs involving the digestive system and abdomen: Secondary | ICD-10-CM | POA: Diagnosis not present

## 2024-08-15 DIAGNOSIS — R1314 Dysphagia, pharyngoesophageal phase: Secondary | ICD-10-CM | POA: Diagnosis not present

## 2024-08-15 DIAGNOSIS — Z1211 Encounter for screening for malignant neoplasm of colon: Secondary | ICD-10-CM | POA: Diagnosis not present

## 2024-08-15 DIAGNOSIS — K200 Eosinophilic esophagitis: Secondary | ICD-10-CM | POA: Diagnosis not present

## 2024-08-15 DIAGNOSIS — K648 Other hemorrhoids: Secondary | ICD-10-CM | POA: Diagnosis not present

## 2024-08-15 DIAGNOSIS — K208 Other esophagitis without bleeding: Secondary | ICD-10-CM | POA: Diagnosis not present

## 2024-08-15 DIAGNOSIS — K219 Gastro-esophageal reflux disease without esophagitis: Secondary | ICD-10-CM | POA: Diagnosis not present

## 2024-08-15 DIAGNOSIS — Z13810 Encounter for screening for upper gastrointestinal disorder: Secondary | ICD-10-CM | POA: Diagnosis not present

## 2024-08-15 DIAGNOSIS — R197 Diarrhea, unspecified: Secondary | ICD-10-CM | POA: Diagnosis not present

## 2024-08-15 DIAGNOSIS — R1319 Other dysphagia: Secondary | ICD-10-CM | POA: Diagnosis not present

## 2024-08-15 DIAGNOSIS — K529 Noninfective gastroenteritis and colitis, unspecified: Secondary | ICD-10-CM | POA: Diagnosis not present

## 2024-08-15 DIAGNOSIS — R7989 Other specified abnormal findings of blood chemistry: Secondary | ICD-10-CM | POA: Diagnosis not present

## 2024-09-11 ENCOUNTER — Ambulatory Visit (INDEPENDENT_AMBULATORY_CARE_PROVIDER_SITE_OTHER): Admitting: Otolaryngology
